# Patient Record
Sex: Male | Born: 1980
Health system: Southern US, Community
[De-identification: ages and names within clinical notes are randomized; demographics above are authoritative.]

## PROBLEM LIST (undated history)

## (undated) DIAGNOSIS — R079 Chest pain, unspecified: Secondary | ICD-10-CM

## (undated) DIAGNOSIS — K5190 Ulcerative colitis, unspecified, without complications: Secondary | ICD-10-CM

## (undated) HISTORY — PX: ARTHROSCOPIC REPAIR ACL: SUR80

## (undated) HISTORY — DX: Chest pain, unspecified: R07.9

## (undated) HISTORY — DX: Ulcerative colitis, unspecified, without complications: K51.90

---

## 2016-03-07 ENCOUNTER — Ambulatory Visit (INDEPENDENT_AMBULATORY_CARE_PROVIDER_SITE_OTHER): Payer: Managed Care, Other (non HMO) | Admitting: Physician Assistant

## 2016-03-07 VITALS — BP 128/84 | HR 65 | Temp 98.2°F | Resp 17 | Ht 67.5 in | Wt 164.0 lb

## 2016-03-07 DIAGNOSIS — R1032 Left lower quadrant pain: Secondary | ICD-10-CM | POA: Diagnosis not present

## 2016-03-07 DIAGNOSIS — R197 Diarrhea, unspecified: Secondary | ICD-10-CM

## 2016-03-07 LAB — POC MICROSCOPIC URINALYSIS (UMFC): MUCUS RE: ABSENT

## 2016-03-07 LAB — POCT CBC
Granulocyte percent: 51.7 %G (ref 37–80)
HCT, POC: 46.8 % (ref 43.5–53.7)
Hemoglobin: 16.7 g/dL (ref 14.1–18.1)
LYMPH, POC: 1.9 (ref 0.6–3.4)
MCH, POC: 30.8 pg (ref 27–31.2)
MCHC: 35.7 g/dL — AB (ref 31.8–35.4)
MCV: 86.3 fL (ref 80–97)
MID (CBC): 1.1 — AB (ref 0–0.9)
MPV: 7.5 fL (ref 0–99.8)
PLATELET COUNT, POC: 210 10*3/uL (ref 142–424)
POC Granulocyte: 3.3 (ref 2–6.9)
POC LYMPH %: 30.9 % (ref 10–50)
POC MID %: 17.4 % — AB (ref 0–12)
RBC: 5.42 M/uL (ref 4.69–6.13)
RDW, POC: 14.8 %
WBC: 6.3 10*3/uL (ref 4.6–10.2)

## 2016-03-07 LAB — POCT URINALYSIS DIP (MANUAL ENTRY)
BILIRUBIN UA: NEGATIVE
Bilirubin, UA: NEGATIVE
Glucose, UA: NEGATIVE
Leukocytes, UA: NEGATIVE
Nitrite, UA: NEGATIVE
PROTEIN UA: NEGATIVE
RBC UA: NEGATIVE
UROBILINOGEN UA: 0.2
pH, UA: 5.5

## 2016-03-07 MED ORDER — MESALAMINE 1.2 G PO TBEC: 4.8000 g | DELAYED_RELEASE_TABLET | Freq: Every day | ORAL | 2 refills | Status: DC

## 2016-03-07 NOTE — Patient Instructions (Addendum)
IF you received an x-ray today, you will receive an invoice from [facility]. Please contact [facility] at [phone] with questions or concerns regarding your invoice.   IF you received labwork today, you will receive an invoice from [facility]. Please contact [facility] at [phone] with questions or concerns regarding your invoice.   Our billing staff will not be able to assist you with questions regarding bills from these companies.  You will be contacted with the lab results as soon as they are available. The fastest way to get your results is to activate your My Chart account. Instructions are located on the last page of this paperwork. If you have not heard from us regarding the results in 2 weeks, please contact this office.    --I am refilling your medication. --please avoid anti-mobility medicines at this time.  We will let this past.  If you develop blood in stool, contact me. --I will contact you with the result of your sample  Food Choices to Help Relieve Diarrhea, Adult When you have diarrhea, the foods you eat and your eating habits are very important. Choosing the right foods and drinks can help relieve diarrhea. Also, because diarrhea can last up to 7 days, you need to replace lost fluids and electrolytes (such as sodium, potassium, and chloride) in order to help prevent dehydration. What general guidelines do I need to follow?  Slowly drink 1 cup (8 oz) of fluid for each episode of diarrhea. If you are getting enough fluid, your urine will be clear or pale yellow.  Eat starchy foods. Some good choices include white rice, white toast, pasta, low-fiber cereal, baked potatoes (without the skin), saltine crackers, and bagels.  Avoid large servings of any cooked vegetables.  Limit fruit to two servings per day. A serving is  cup or 1 small piece.  Choose foods with less than 2 g of fiber per serving.  Limit fats to less than 8 tsp (38 g) per  day.  Avoid fried foods.  Eat foods that have probiotics in them. Probiotics can be found in certain dairy products.  Avoid foods and beverages that may increase the speed at which food moves through the stomach and intestines (gastrointestinal tract). Things to avoid include:  High-fiber foods, such as dried fruit, raw fruits and vegetables, nuts, seeds, and whole grain foods.  Spicy foods and high-fat foods.  Foods and beverages sweetened with high-fructose corn syrup, honey, or sugar alcohols such as xylitol, sorbitol, and mannitol. What foods are recommended? Grains  White rice. White, [place]French, or pita breads (fresh or toasted), including plain rolls, buns, or bagels. White pasta. Saltine, soda, or graham crackers. Pretzels. Low-fiber cereal. Cooked cereals made with water (such as cornmeal, farina, or cream cereals). Plain muffins. Matzo. Melba toast. Zwieback. Vegetables  Potatoes (without the skin). Strained tomato and vegetable juices. Most well-cooked and canned vegetables without seeds. Tender lettuce. Fruits  Cooked or canned applesauce, apricots, cherries, fruit cocktail, grapefruit, peaches, pears, or plums. Fresh bananas, apples without skin, cherries, grapes, cantaloupe, grapefruit, peaches, oranges, or plums. Meat and Other Protein Products  Baked or boiled chicken. Eggs. Tofu. Fish. Seafood. Smooth peanut butter. Ground or well-cooked tender beef, ham, veal, lamb, pork, or poultry. Dairy  Plain yogurt, kefir, and unsweetened liquid yogurt. Lactose-free milk, buttermilk, or soy milk. Plain hard cheese. Beverages  Sport drinks. Clear broths. Diluted fruit juices (except prune). Regular, caffeine-free sodas such as ginger ale. Water. Decaffeinated teas. Oral rehydration solutions. Sugar-free beverages  not sweetened with sugar alcohols. Other  Bouillon, broth, or soups made from recommended foods. The items listed above may not be a complete list of recommended foods or  beverages. Contact your dietitian for more options.  What foods are not recommended? Grains  Whole grain, whole wheat, bran, or rye breads, rolls, pastas, crackers, and cereals. Wild or brown rice. Cereals that contain more than 2 g of fiber per serving. Corn tortillas or taco shells. Cooked or dry oatmeal. Granola. Popcorn. Vegetables  Raw vegetables. Cabbage, broccoli, Brussels sprouts, artichokes, baked beans, beet greens, corn, kale, legumes, peas, sweet potatoes, and yams. Potato skins. Cooked spinach and cabbage. Fruits  Dried fruit, including raisins and dates. Raw fruits. Stewed or dried prunes. Fresh apples with skin, apricots, mangoes, pears, raspberries, and strawberries. Meat and Other Protein Products  Chunky peanut butter. Nuts and seeds. Beans and lentils. Tomasa Blase. Dairy  High-fat cheeses. Milk, chocolate milk, and beverages made with milk, such as milk shakes. Cream. Ice cream. Sweets and Desserts  Sweet rolls, doughnuts, and sweet breads. Pancakes and waffles. Fats and Oils  Butter. Cream sauces. Margarine. Salad oils. Plain salad dressings. Olives. Avocados. Beverages  Caffeinated beverages (such as coffee, tea, soda, or energy drinks). Alcoholic beverages. Fruit juices with pulp. Prune juice. Soft drinks sweetened with high-fructose corn syrup or sugar alcohols. Other  Coconut. Hot sauce. Chili powder. Mayonnaise. Gravy. Cream-based or milk-based soups. The items listed above may not be a complete list of foods and beverages to avoid. Contact your dietitian for more information.  What should I do if I become dehydrated? Diarrhea can sometimes lead to dehydration. Signs of dehydration include dark urine and dry mouth and skin. If you think you are dehydrated, you should rehydrate with an oral rehydration solution. These solutions can be purchased at pharmacies, retail stores, or online. Drink -1 cup (120-240 mL) of oral rehydration solution each time you have an episode of  diarrhea. If drinking this amount makes your diarrhea worse, try drinking smaller amounts more often. For example, drink 1-3 tsp (5-15 mL) every 5-10 minutes. A general rule for staying hydrated is to drink 1-2 L of fluid per day. Talk to your health care provider about the specific amount you should be drinking each day. Drink enough fluids to keep your urine clear or pale yellow. This information is not intended to replace advice given to you by your health care provider. Make sure you discuss any questions you have with your health care provider. Document Released: 04/15/2003 Document Revised: 07/01/2015 Document Reviewed: 12/16/2012 Elsevier Interactive Patient Education  2017 [employer].

## 2016-03-07 NOTE — Progress Notes (Signed)
[facility] [address], [place] [address] [phone]  Date:  03/07/2016   Name:  Jordan Greene   DOB:  07/15/1980   MRN:  [id number]  PCP:  No primary care provider on file.    History of Present Illness:  Jordan Greene is a 36 y.o. male patient who [facility] of abdominal pain and diarrhea.    Chief Complaint  Patient presents with  . Diarrhea  . Abdominal Pain   15 episodes of green diarrhea 6 days ago.  This began to improve, 3 days later, appears to resolve.  5 days later, the diarrhea resurfaced to 8 episodes of diarrhea, which was brown without blood or black stool.  He has periumbilical pain after bowel movement.  No fever.  Yesterday, he had some dysuria only once but this resolved.  No hematuria, or frequency.   Hx of ulcerative colitis.  He has been about 2-3 weeks without his medication of mesalamine.  Changing providers, and does not have a pcp.  Plans to visit gastroenterologist in 7 days. No nausea or vomiting.  Does not recall eating anything that has had any sxs in others.    There are no active problems to display for this patient.   No past medical history on file.  No past surgical history on file.  Social History  Substance Use Topics  . Smoking status: Never Smoker  . Smokeless tobacco: Never Used  . Alcohol use No    No family history on file.  Not on File  Medication list has been reviewed and updated.  No current outpatient prescriptions on file prior to visit.   No current facility-administered medications on file prior to visit.     ROS ROS otherwise unremarkable unless listed above.   Physical Examination: BP 128/84 (BP Location: Right Arm, Patient Position: Sitting, Cuff Size: Normal)   Pulse 65   Temp 98.2 F (36.8 C) (Oral)   Resp 17   Ht 5' 7.5" (1.715 m)   Wt 164 lb (74.4 kg)   SpO2 99%   BMI 25.31 kg/m  Ideal Body Weight: Weight in (lb) to have BMI = 25: 161.7  Physical Exam   Constitutional: He is oriented to person, place, and time. He appears well-developed and well-nourished. No distress.  HENT:  Head: Normocephalic and atraumatic.  Eyes: Conjunctivae and EOM are normal. Pupils are equal, round, and reactive to light.  Cardiovascular: Normal rate.   Pulmonary/Chest: Effort normal. No respiratory distress.  Abdominal: Soft. Normal appearance and bowel sounds are normal. There is no hepatosplenomegaly. There is tenderness in the suprapubic area.  Neurological: He is alert and oriented to person, place, and time.  Skin: Skin is warm and dry. He is not diaphoretic.  Psychiatric: He has a normal mood and affect. His behavior is normal.    Results for orders placed or performed in visit on 03/07/16  POCT CBC  Result Value Ref Range   WBC 6.3 4.6 - 10.2 K/uL   Lymph, poc 1.9 0.6 - 3.4   POC LYMPH PERCENT 30.9 10 - 50 %L   MID (cbc) 1.1 (A) 0 - 0.9   POC MID % 17.4 (A) 0 - 12 %M   POC Granulocyte 3.3 2 - 6.9   Granulocyte percent 51.7 37 - 80 %G   RBC 5.42 4.69 - 6.13 M/uL   Hemoglobin 16.7 14.1 - 18.1 g/dL   HCT, POC 29.546.8 62.143.5 - 53.7 %  MCV 86.3 80 - 97 fL   MCH, POC 30.8 27 - 31.2 pg   MCHC 35.7 (A) 31.8 - 35.4 g/dL   RDW, POC 16.1 %   Platelet Count, POC 210 142 - 424 K/uL   MPV 7.5 0 - 99.8 fL  POCT Microscopic Urinalysis ([facility])  Result Value Ref Range   WBC,UR,HPF,POC None None WBC/hpf   RBC,UR,HPF,POC None None RBC/hpf   Bacteria None None, Too numerous to count   Mucus Absent Absent   Epithelial Cells, UR Per Microscopy None None, Too numerous to count cells/hpf  POCT urinalysis dipstick  Result Value Ref Range   Color, UA yellow yellow   Clarity, UA clear clear   Glucose, UA negative negative   Bilirubin, UA negative negative   Ketones, POC UA negative negative   Spec Grav, UA <=1.005    Blood, UA negative negative   pH, UA 5.5    Protein Ur, POC negative negative   Urobilinogen, UA 0.2    Nitrite, UA Negative Negative    Leukocytes, UA Negative Negative     Assessment and Plan: Jordan Greene is a 36 y.o. male who is here today for abdominal pain or diarrhea. --will restart her medication.  Possible colitis, and with is restarting, will obtain stool culture today.   --drug interaction with Levsin and medications for abdominal cramping.  Advised diarrhea friendly diet.  It appears to be improving again.  possibility lack of UC treatment could be culprit.  Will follow up if sxs do not improve or blood stools occur.  Left lower quadrant pain - Plan: POCT CBC, POCT Microscopic Urinalysis ([facility]), POCT urinalysis dipstick, GI Profile, Stool, PCR  Diarrhea, unspecified type - Plan: GI Profile, Stool, PCR  Trena Platt, PA-C [facility] 2/2/20188:58 AM

## 2016-03-08 LAB — GI PROFILE, STOOL, PCR
ASTROVIRUS: NOT DETECTED
Adenovirus F 40/41: NOT DETECTED
C difficile toxin A/B: NOT DETECTED
CYCLOSPORA CAYETANENSIS: NOT DETECTED
Campylobacter: NOT DETECTED
Cryptosporidium: NOT DETECTED
ENTAMOEBA HISTOLYTICA: NOT DETECTED
ENTEROPATHOGENIC E COLI: NOT DETECTED
Enteroaggregative E coli: NOT DETECTED
Enterotoxigenic E coli: NOT DETECTED
Giardia lamblia: NOT DETECTED
Norovirus GI/GII: NOT DETECTED
PLESIOMONAS SHIGELLOIDES: NOT DETECTED
ROTAVIRUS A: NOT DETECTED
SAPOVIRUS: NOT DETECTED
SHIGA-TOXIN-PRODUCING E COLI: NOT DETECTED
SHIGELLA/ENTEROINVASIVE E COLI: NOT DETECTED
Salmonella: NOT DETECTED
VIBRIO CHOLERAE: NOT DETECTED
VIBRIO: NOT DETECTED
Yersinia enterocolitica: NOT DETECTED

## 2016-06-20 ENCOUNTER — Other Ambulatory Visit: Payer: Self-pay | Admitting: Emergency Medicine

## 2016-06-20 MED ORDER — MESALAMINE 1.2 G PO TBEC: 4.8000 g | DELAYED_RELEASE_TABLET | Freq: Every day | ORAL | 2 refills | Status: DC

## 2017-01-24 ENCOUNTER — Encounter: Payer: Managed Care, Other (non HMO) | Admitting: Physician Assistant

## 2017-01-25 ENCOUNTER — Encounter: Payer: Managed Care, Other (non HMO) | Admitting: Physician Assistant

## 2017-05-16 ENCOUNTER — Encounter: Payer: Self-pay | Admitting: Physician Assistant

## 2017-09-14 ENCOUNTER — Telehealth: Payer: Self-pay | Admitting: Gastroenterology

## 2017-09-14 NOTE — Telephone Encounter (Signed)
I called Dr Chales AbrahamsGupta and gave him this message.

## 2017-09-21 ENCOUNTER — Encounter: Payer: Self-pay | Admitting: Gastroenterology

## 2017-09-25 ENCOUNTER — Encounter

## 2017-09-25 ENCOUNTER — Ambulatory Visit (INDEPENDENT_AMBULATORY_CARE_PROVIDER_SITE_OTHER): Payer: Managed Care, Other (non HMO) | Admitting: Gastroenterology

## 2017-09-25 ENCOUNTER — Encounter: Payer: Self-pay | Admitting: Gastroenterology

## 2017-09-25 ENCOUNTER — Other Ambulatory Visit (INDEPENDENT_AMBULATORY_CARE_PROVIDER_SITE_OTHER): Payer: Managed Care, Other (non HMO)

## 2017-09-25 VITALS — BP 108/72 | HR 81 | Ht 68.0 in | Wt 152.5 lb

## 2017-09-25 DIAGNOSIS — K51919 Ulcerative colitis, unspecified with unspecified complications: Secondary | ICD-10-CM | POA: Diagnosis not present

## 2017-09-25 LAB — CBC WITH DIFFERENTIAL/PLATELET
Basophils Absolute: 0 10*3/uL (ref 0.0–0.1)
Basophils Relative: 0.2 % (ref 0.0–3.0)
EOS ABS: 0 10*3/uL (ref 0.0–0.7)
Eosinophils Relative: 0.1 % (ref 0.0–5.0)
HEMATOCRIT: 47.3 % (ref 39.0–52.0)
HEMOGLOBIN: 15.8 g/dL (ref 13.0–17.0)
LYMPHS PCT: 12.3 % (ref 12.0–46.0)
Lymphs Abs: 1 10*3/uL (ref 0.7–4.0)
MCHC: 33.4 g/dL (ref 30.0–36.0)
MCV: 89.4 fl (ref 78.0–100.0)
MONO ABS: 0.5 10*3/uL (ref 0.1–1.0)
Monocytes Relative: 5.7 % (ref 3.0–12.0)
Neutro Abs: 6.9 10*3/uL (ref 1.4–7.7)
Neutrophils Relative %: 81.7 % — ABNORMAL HIGH (ref 43.0–77.0)
Platelets: 247 10*3/uL (ref 150.0–400.0)
RBC: 5.29 Mil/uL (ref 4.22–5.81)
RDW: 14.6 % (ref 11.5–15.5)
WBC: 8.4 10*3/uL (ref 4.0–10.5)

## 2017-09-25 LAB — COMPREHENSIVE METABOLIC PANEL
ALBUMIN: 4.3 g/dL (ref 3.5–5.2)
ALK PHOS: 52 U/L (ref 39–117)
ALT: 19 U/L (ref 0–53)
AST: 14 U/L (ref 0–37)
BUN: 10 mg/dL (ref 6–23)
CALCIUM: 9.8 mg/dL (ref 8.4–10.5)
CHLORIDE: 102 meq/L (ref 96–112)
CO2: 32 mEq/L (ref 19–32)
CREATININE: 1 mg/dL (ref 0.40–1.50)
GFR: 88.98 mL/min (ref >60.00)
Glucose, Bld: 85 mg/dL (ref 70–99)
POTASSIUM: 5 meq/L (ref 3.5–5.1)
SODIUM: 139 meq/L (ref 135–145)
TOTAL PROTEIN: 7.2 g/dL (ref 6.0–8.3)
Total Bilirubin: 0.4 mg/dL (ref 0.2–1.2)

## 2017-09-25 LAB — C-REACTIVE PROTEIN: CRP: 0.1 mg/dL — AB (ref 0.5–20.0)

## 2017-09-25 MED ORDER — PREDNISONE 10 MG PO TABS: 10.0000 mg | ORAL_TABLET | ORAL | 0 refills | Status: DC

## 2017-09-25 MED ORDER — MESALAMINE 1.2 G PO TBEC: 1.2000 g | DELAYED_RELEASE_TABLET | Freq: Four times a day (QID) | ORAL | 11 refills | Status: DC

## 2017-09-25 MED ORDER — PANTOPRAZOLE SODIUM 40 MG PO TBEC: 40.0000 mg | DELAYED_RELEASE_TABLET | Freq: Every day | ORAL | 4 refills | Status: DC

## 2017-09-25 NOTE — Patient Instructions (Addendum)
If you are age 38 or older, your body mass index should be between 23-30. Your Body mass index is 23.19 kg/m. If this is out of the aforementioned range listed, please consider follow up with your Primary Care Provider.  If you are age 38 or younger, your body mass index should be between 19-25. Your Body mass index is 23.19 kg/m. If this is out of the aformentioned range listed, please consider follow up with your Primary Care Provider.   You have been scheduled for a colonoscopy. Please follow written instructions given to you at your visit today.  Please pick up your prep supplies at the pharmacy within the next 1-3 days. If you use inhalers (even only as needed), please bring them with you on the day of your procedure. Your physician has requested that you go to www.startemmi.com and enter the access code given to you at your visit today. This web site gives a general overview about your procedure. However, you should still follow specific instructions given to you by our office regarding your preparation for the procedure.  We have sent the following medications to your pharmacy for you to pick up at your convenience: Prednisone 10mg  Take 2 tablets by mouth once daily for two weeks then decrease to 1 tablet by mouth once daily for two weeks, then stop. Protonix 40mg  once daily for 30 days. Lialda 1.2g four times daily.  Please purchase the following medications over the counter and take as directed: Miralax  Please go to the lab on the 2nd floor [address] before you leave the office today.   Thank you,  Dr. Lynann Bolognaajesh Gupta

## 2017-09-25 NOTE — Progress Notes (Signed)
IMPRESSION and PLAN:    #1.  Pan ulcerative colitis-diagnosed in early 30s, colonoscopy in 2015 in [place] showed pancolitis.  Patient had been on azathioprine 75 mg p.o. once a day but stopped and mesalamine 1.2 g/tab. 4/day. With recent exacerbation treated with antibiotics (oflox/ornidazole x 8 days -meds from [place]) and prednisone.  Currently on tapering dose. Plan: -Taper down prednisone to 20mg  po qd x 2 weeks, 10mg  po qd x 2 weeks, then stop. -Stool tests for GI pathogen including C. difficile and WBCs. -Check CBC, CMP and CRP, TB gold and HBsAg today. -Proceed with colonoscopy.  I discussed risks and benefits.  He will be given MiraLAX preparation. -He will continue taking mesalamine 1.2 g/tab. 4/day. -I have discussed regarding biologic's and immunomodulator therapy.  Depending upon the above blood tests and colonoscopy, would like to reinitiate azathioprine with or without Xeljanz or Humira.  Discussed risks and benefits. -Consider bone density scan when he follows up with Dr. Mathis BudUppin. -Discussed above with the patient and patient's family.     #2. Heartburn -likely related to prednisone. Plan: Start  protonix 30mg  po qd, 4 refills.       HPI:    Chief Complaint:   Jordan Greene is a 37 y.o. male  With ulcerative colitis diagnosed in early 6330s. Had colonoscopy and then subsequently sigmoidoscopy in 2015 in [place] showing pancolitis. Had CT scan 01/03/2017 which showed fairly long segment of rectosigmoid colon-mild colitis, stricture could not be ruled out. Started having increasing diarrhea with bloody bowel movements after eating out more than a month ago. Seen by Dr.Uppin.  Has been started on steroids, antibiotics from [place] He has been doing much better Baseline bowel movements of 5-6 looser bowel movements per day without any blood. Denies having any joint pains or skin rash Denies having any significant back pain. Comes to the GI clinic for follow-up visit. Has  been having heartburn ever since he has been on prednisone.   Past Medical History:  Diagnosis Date  . Ulcerative colitis (HCC)     Current Outpatient Medications  Medication Sig Dispense Refill  . AMBULATORY NON FORMULARY MEDICATION Ofloxacin/ornidozole 200mg  2 PO QD    . mesalamine (LIALDA) 1.2 g EC tablet Take 4 tablets (4.8 g total) by mouth daily with breakfast. 120 tablet 2  . predniSONE (DELTASONE) 10 MG tablet Take 20 mg by mouth daily.     No current facility-administered medications for this visit.     Past Surgical History:  Procedure Laterality Date  . ARTHROSCOPIC REPAIR ACL      Family History  Problem Relation Age of Onset  . Colon cancer Neg Hx   . Esophageal cancer Neg Hx   . Heart disease Neg Hx     Social History   Tobacco Use  . Smoking status: Never Smoker  . Smokeless tobacco: Never Used  Substance Use Topics  . Alcohol use: No  . Drug use: No    Allergies  Allergen Reactions  . Other     Dairy-Milk Red Meat     Review of Systems: All systems reviewed and negative except where noted in HPI.    Physical Exam:     BP 108/72   Pulse 81   Ht 5\' 8"  (1.727 m)   Wt 152 lb 8 oz (69.2 kg)   BMI 23.19 kg/m  @WEIGHTLAST3 @ GENERAL:  Alert, oriented, cooperative, not in acute distress. PSYCH: :Pleasant, normal mood and affect. HEENT:  conjunctiva pink, mucous  membranes moist, neck supple without masses. No jaundice. CARDIAC:  S1 S2 normal. No murmers. PULM: Normal respiratory effort, lungs CTA bilaterally, no wheezing. ABDOMEN: Inspection: No visible peristalsis, no abnormal pulsations, skin normal.  Palpation/percussion: Soft, nontender, nondistended, no rigidity, no abnormal dullness to percussion, no hepatosplenomegaly and no palpable abdominal masses.  Auscultation: Normal bowel sounds, no abdominal bruits. Rectal exam: Deferred SKIN:  turgor, no lesions seen. Musculoskeletal:  Normal muscle tone, normal strength. NEURO: Alert and  oriented x 3, no focal neurologic deficits.   Veleta Yamamoto,MD 09/25/2017, 9:27 AM   CC Dr Mathis BudUppin

## 2017-09-27 ENCOUNTER — Other Ambulatory Visit (INDEPENDENT_AMBULATORY_CARE_PROVIDER_SITE_OTHER): Payer: Managed Care, Other (non HMO)

## 2017-09-27 ENCOUNTER — Other Ambulatory Visit: Payer: Self-pay

## 2017-09-27 ENCOUNTER — Other Ambulatory Visit: Payer: Managed Care, Other (non HMO)

## 2017-09-27 DIAGNOSIS — K51919 Ulcerative colitis, unspecified with unspecified complications: Secondary | ICD-10-CM

## 2017-09-27 LAB — QUANTIFERON-TB GOLD PLUS
NIL: 0.02 [IU]/mL
QUANTIFERON-TB GOLD PLUS: NEGATIVE
TB1-NIL: 0.01 IU/mL
TB2-NIL: 0.02 [IU]/mL

## 2017-09-27 NOTE — Addendum Note (Signed)
Addended by: Harley AltoPRICE, KRISTY M on: 09/27/2017 09:44 AM   Modules accepted: Orders

## 2017-09-28 LAB — HEPATITIS B SURFACE ANTIGEN: Hepatitis B Surface Ag: NONREACTIVE

## 2017-10-01 ENCOUNTER — Other Ambulatory Visit: Payer: Self-pay

## 2017-10-01 DIAGNOSIS — K51919 Ulcerative colitis, unspecified with unspecified complications: Secondary | ICD-10-CM

## 2017-10-01 MED ORDER — AZATHIOPRINE 75 MG PO TABS: 75.0000 mg | ORAL_TABLET | Freq: Every day | ORAL | 1 refills | Status: DC

## 2017-10-04 LAB — GASTROINTESTINAL PATHOGEN PANEL PCR
C. difficile Tox A/B, PCR: NOT DETECTED
CAMPYLOBACTER, PCR: NOT DETECTED
Cryptosporidium, PCR: NOT DETECTED
E COLI 0157, PCR: NOT DETECTED
E coli (ETEC) LT/ST PCR: NOT DETECTED
E coli (STEC) stx1/stx2, PCR: NOT DETECTED
Giardia lamblia, PCR: NOT DETECTED
Norovirus, PCR: NOT DETECTED
Rotavirus A, PCR: NOT DETECTED
SALMONELLA, PCR: NOT DETECTED
SHIGELLA, PCR: NOT DETECTED

## 2017-10-04 LAB — FECAL LACTOFERRIN, QUANT
FECAL LACTOFERRIN: NEGATIVE
MICRO NUMBER:: 91003462
SPECIMEN QUALITY:: ADEQUATE

## 2017-10-17 ENCOUNTER — Ambulatory Visit: Payer: Self-pay | Admitting: Gastroenterology

## 2017-11-02 ENCOUNTER — Encounter: Payer: Self-pay | Admitting: Gastroenterology

## 2017-11-16 ENCOUNTER — Encounter: Payer: Self-pay | Admitting: Gastroenterology

## 2017-11-16 ENCOUNTER — Ambulatory Visit (AMBULATORY_SURGERY_CENTER): Payer: Managed Care, Other (non HMO) | Admitting: Gastroenterology

## 2017-11-16 VITALS — BP 101/62 | HR 73 | Temp 98.9°F | Resp 17 | Ht 68.0 in | Wt 152.5 lb

## 2017-11-16 DIAGNOSIS — K51919 Ulcerative colitis, unspecified with unspecified complications: Secondary | ICD-10-CM | POA: Diagnosis present

## 2017-11-16 HISTORY — PX: COLONOSCOPY: SHX174

## 2017-11-16 MED ORDER — SODIUM CHLORIDE 0.9 % IV SOLN: 500.0000 mL | Freq: Once | INTRAVENOUS | Status: DC

## 2017-11-16 MED ORDER — AZATHIOPRINE 75 MG PO TABS: 75.0000 mg | ORAL_TABLET | Freq: Every day | ORAL | 1 refills | Status: DC

## 2017-11-16 NOTE — Patient Instructions (Signed)
Impression/Recommendations:  Colitis handout given to patient.  Resume previous diet. Continue present medications.  Restart azthioprine 75 mg. By mouth once daily.  Await pathology results.  Return to GI office in 4 weeks.  YOU HAD AN ENDOSCOPIC PROCEDURE TODAY AT THE [facility]:   Refer to the procedure report that was given to you for any specific questions about what was found during the examination.  If the procedure report does not answer your questions, please call your gastroenterologist to clarify.  If you requested that your care partner not be given the details of your procedure findings, then the procedure report has been included in a sealed envelope for you to review at your convenience later.  YOU SHOULD EXPECT: Some feelings of bloating in the abdomen. Passage of more gas than usual.  Walking can help get rid of the air that was put into your GI tract during the procedure and reduce the bloating. If you had a lower endoscopy (such as a colonoscopy or flexible sigmoidoscopy) you may notice spotting of blood in your stool or on the toilet paper. If you underwent a bowel prep for your procedure, you may not have a normal bowel movement for a few days.  Please Note:  You might notice some irritation and congestion in your nose or some drainage.  This is from the oxygen used during your procedure.  There is no need for concern and it should clear up in a day or so.  SYMPTOMS TO REPORT IMMEDIATELY:   Following lower endoscopy (colonoscopy or flexible sigmoidoscopy):  Excessive amounts of blood in the stool  Significant tenderness or worsening of abdominal pains  Swelling of the abdomen that is new, acute  Fever of 100F or higher   Following upper endoscopy (EGD)  Vomiting of blood or coffee ground material  New chest pain or pain under the shoulder blades  Painful or persistently difficult swallowing  New shortness of breath  Fever of 100F or  higher  Black, tarry-looking stools  For urgent or emergent issues, a gastroenterologist can be reached at any hour by calling [phone].   DIET:  We do recommend a small meal at first, but then you may proceed to your regular diet.  Drink plenty of fluids but you should avoid alcoholic beverages for 24 hours.  ACTIVITY:  You should plan to take it easy for the rest of today and you should NOT DRIVE or use heavy machinery until tomorrow (because of the sedation medicines used during the test).    FOLLOW UP: Our staff will call the number listed on your records the next business day following your procedure to check on you and address any questions or concerns that you may have regarding the information given to you following your procedure. If we do not reach you, we will leave a message.  However, if you are feeling well and you are not experiencing any problems, there is no need to return our call.  We will assume that you have returned to your regular daily activities without incident.  If any biopsies were taken you will be contacted by phone or by letter within the next 1-3 weeks.  Please call us at [phone] if you have not heard about the biopsies in 3 weeks.    SIGNATURES/CONFIDENTIALITY: You and/or your care partner have signed paperwork which will be entered into your electronic medical record.  These signatures attest to the fact that that the information above on your After  Visit Summary has been reviewed and is understood.  Full responsibility of the confidentiality of this discharge information lies with you and/or your care-partner. 

## 2017-11-16 NOTE — Op Note (Signed)
[facility] Patient Name: Jordan Greene Procedure Date: 11/16/2017 3:23 PM MRN: [id number] Endoscopist: Lynann Bologna , MD Age: 37 Referring MD:  Date of Birth: Dec 04, 1980 Gender: Male Account #: [id number] Procedure:                Colonoscopy Indications:              Rectal bleeding - H/O ulcerative colitis-Dx in                            early 4s, colonoscopy in 2015 in [place] showed ?                            pancolitis. Patient had been on azathioprine 75 mg                            p.o. once a day but stopped and mesalamine 1.2                            g/tab. 4/day. With recent exacerbation treated with                            antibiotics (oflox/ornidazole x 8 days -meds from                            [place]) and prednisone. Currently on tapering dose. Medicines:                Monitored Anesthesia Care Procedure:                Pre-Anesthesia Assessment:                           - Prior to the procedure, a History and Physical                            was performed, and patient medications and                            allergies were reviewed. The patient's tolerance of                            previous anesthesia was also reviewed. The risks                            and benefits of the procedure and the sedation                            options and risks were discussed with the patient.                            All questions were answered, and informed consent                            was obtained. Prior Anticoagulants: The patient has  taken no previous anticoagulant or antiplatelet                            agents. ASA Grade Assessment: I - A normal, healthy                            patient. After reviewing the risks and benefits,                            the patient was deemed in satisfactory condition to                            undergo the procedure.                           After obtaining informed  consent, the colonoscope                            was passed under direct vision. Throughout the                            procedure, the patient's blood pressure, pulse, and                            oxygen saturations were monitored continuously. The                            Colonoscope was introduced through the anus and                            advanced to the 4 cm into the ileum. The                            colonoscopy was performed without difficulty. The                            patient tolerated the procedure well. The quality                            of the bowel preparation was excellent. Scope In: 3:43:09 PM Scope Out: 4:06:00 PM Scope Withdrawal Time: 0 hours 15 minutes 13 seconds  Total Procedure Duration: 0 hours 22 minutes 51 seconds  Findings:                 Inflammation characterized by erosions, erythema                            and friability was found in a continuous and                            circumferential pattern from the rectum to the                            descending colon upto 45 cm. This was mild to  moderate in severity. Biopsies were taken with a                            cold forceps for histology. Estimated blood loss                            was minimal. The remaining mucosa -including                            transverse colon, ascending colon was normal with                            well preserved vascular pattern. Biopsies were                            taken with a cold forceps for histology from the                            right colon as well. This was graded as Mayo Score                            2 (moderate, with marked erythema, absent vascular                            pattern, friability, erosions).                           The TI had two 2 mm small non-bleeding erosions. No                            stigmata of recent bleeding were seen. Biopsies                            were  taken with a cold forceps for histology. Complications:            No immediate complications. Estimated Blood Loss:     Estimated blood loss was minimal. Impression:               - Left-sided ulcerative colitis (mild-moderate                            activity) (biopsied)                           - Two very small erosions terminal ileum. Biopsied. Recommendation:           - Patient has a contact number available for                            emergencies. The signs and symptoms of potential                            delayed complications were discussed with the  patient. Return to normal activities tomorrow.                            Written discharge instructions were provided to the                            patient.                           - Resume previous diet.                           - Continue present medications. Restart                            azathioprine 75 mg p.o. once a day as before.                           - Await pathology results.                           - Return to my office in 4 weeks. Lynann Bologna, MD 11/16/2017 4:22:29 PM This report has been signed electronically.

## 2017-11-16 NOTE — Progress Notes (Signed)
Called to room to assist during endoscopic procedure.  Patient ID and intended procedure confirmed with present staff. Received instructions for my participation in the procedure from the performing physician.  

## 2017-11-16 NOTE — Progress Notes (Signed)
Dr. Chales Abrahams talking with pt. And his wife at length after completion of vital sign monitoring, thus prolonging time to discharge.

## 2017-11-16 NOTE — Progress Notes (Signed)
To PACU, vss patent aw report to rn 

## 2017-11-16 NOTE — Progress Notes (Signed)
History reviewed today 

## 2017-11-19 ENCOUNTER — Telehealth: Payer: Self-pay

## 2017-11-19 NOTE — Telephone Encounter (Signed)
  Follow up Call-  Call back number 11/16/2017  Post procedure Call Back phone  # [phone]  Permission to leave phone message Yes     Patient questions:  Do you have a fever, pain , or abdominal swelling? No. Pain Score  0 *  Have you tolerated food without any problems? Yes.    Have you been able to return to your normal activities? Yes.    Do you have any questions about your discharge instructions: Diet   No. Medications  No. Follow up visit  No.  Do you have questions or concerns about your Care? No.  Actions: * If pain score is 4 or above: No action needed, pain <4.

## 2017-11-27 ENCOUNTER — Encounter: Payer: Self-pay | Admitting: Gastroenterology

## 2017-12-16 ENCOUNTER — Other Ambulatory Visit: Payer: Self-pay | Admitting: Gastroenterology

## 2017-12-25 ENCOUNTER — Ambulatory Visit: Payer: Managed Care, Other (non HMO) | Admitting: Gastroenterology

## 2018-11-27 ENCOUNTER — Encounter: Payer: Self-pay | Admitting: Gastroenterology

## 2018-11-27 ENCOUNTER — Other Ambulatory Visit: Payer: Self-pay

## 2018-11-27 ENCOUNTER — Ambulatory Visit (INDEPENDENT_AMBULATORY_CARE_PROVIDER_SITE_OTHER): Payer: BC Managed Care – PPO | Admitting: Gastroenterology

## 2018-11-27 VITALS — BP 110/72 | HR 86 | Temp 97.9°F | Ht 68.0 in | Wt 159.1 lb

## 2018-11-27 DIAGNOSIS — K51919 Ulcerative colitis, unspecified with unspecified complications: Secondary | ICD-10-CM

## 2018-11-27 MED ORDER — AZATHIOPRINE 50 MG PO TABS: 50.0000 mg | ORAL_TABLET | Freq: Every day | ORAL | 6 refills | Status: DC

## 2018-11-27 MED ORDER — PREDNISONE 10 MG PO TABS: ORAL_TABLET | ORAL | 0 refills | Status: DC

## 2018-11-27 NOTE — Progress Notes (Signed)
IMPRESSION and PLAN:    #1. Left sided ulcerative colitis-Dx at age 38 on colon 75 in [place]. Was treated with mesalamine/azathioprine 75 mg PO QD (stopped AZA on his own as he got better). Last colon 11/2017- UC upto 45 cm (mid- desc colon) with mod activity on mesalamine and advised to resume AZA (didn't pick prescription). Moved back to [place]. With excerebration.  Plan: - Lialda 4.8g po qd to continue - Start prednisone 40 mg p.o. once a day for 1 week, 30 mg p.o. once a day for 1 week, followed by 20 mg p.o. once a day for 2 weeks, then 10 mg p.o. once a day for 2 weeks and then stop. - Restart imuran 50 mg po qd #30, 6 refiils - Blood tests - CBC, CMP, CRP, TPMT level, TB gold and HBsAg. He wants to get it done in next 2 weeks when he has appt with Dr Rica Records - RTC 12 weeks.  May need repeat colonoscopy in 6 months to a year to ensure mucosal healing. -Consider bone density scan when he follows up with Dr. Rica Records.   HPI:    Jordan Greene is a 38 y.o. male Was doing very well on mesalamine 4.8 g p.o. once a day Did not pick up Imuran after colonoscopy. Everybody in the family had mild gastroenteritis 2 months ago including his daughter and wife.  However, he continued to have problems and started having bloody diarrhea 4 to 6 weeks ago.  Apparently stool studies were negative.  He was given short course of prednisone for 4 to 5 days which did result in improved stool frequency from 7/day down to 3/day.  Denies having any urgency, or nocturnal symptoms.  Has lost 3 to 4 pounds over the last 1 month.  No fever or chills.  Associated mild lower abdominal discomfort.  Denies use of antibiotics or nonsteroidals.  Willing to go back on Imuran.  Moved back from [place].  Past Medical History:  Diagnosis Date  . Ulcerative colitis ([place])     Current Outpatient Medications  Medication Sig Dispense Refill  . mesalamine (LIALDA) 1.2 g EC tablet Take 4 tablets (4.8 g total) by mouth  daily with breakfast. 120 tablet 2  . Vitamin D, Ergocalciferol, (DRISDOL) 1.25 MG (50000 UT) CAPS capsule 1 capsule once a week.    . azaTHIOprine (IMURAN) 50 MG tablet Take 1 tablet (50 mg total) by mouth daily. 30 tablet 6  . predniSONE (DELTASONE) 10 MG tablet We have sent the following medications to your pharmacy for you to pick up at your convenience: Prednisone 10mg  tablet 40mg  by mouth x 1 week. 30mg  by mouth x 1 week. 20mg  by mouth x 2 weeks. 10mg  by mouth x 2 weeks. 100 tablet 0   No current facility-administered medications for this visit.     Past Surgical History:  Procedure Laterality Date  . ARTHROSCOPIC REPAIR ACL    . COLONOSCOPY  11/16/2017    Family History  Problem Relation Age of Onset  . Colon cancer Neg Hx   . Esophageal cancer Neg Hx   . Heart disease Neg Hx   . Colon polyps Neg Hx   . Rectal cancer Neg Hx   . Stomach cancer Neg Hx     Social History   Tobacco Use  . Smoking status: Never Smoker  . Smokeless tobacco: Never Used  Substance Use Topics  . Alcohol use: No  . Drug use: No  Allergies  Allergen Reactions  . Other     Dairy-Milk Red Meat     Review of Systems: All systems reviewed and negative except where noted in HPI.    Physical Exam:     BP 110/72   Pulse 86   Temp 97.9 F (36.6 C)   Ht 5\' 8"  (1.727 m)   Wt 159 lb 2 oz (72.2 kg)   BMI 24.19 kg/m  @WEIGHTLAST3 @ GENERAL:  Alert, oriented, cooperative, not in acute distress. PSYCH: :Pleasant, normal mood and affect. HEENT:  conjunctiva pink, mucous membranes moist, neck supple without masses. No jaundice. CARDIAC:  S1 S2 normal. No murmers. PULM: Normal respiratory effort, lungs CTA bilaterally, no wheezing. ABDOMEN: Inspection: No visible peristalsis, no abnormal pulsations, skin normal.  Palpation/percussion: Soft, mild tenderness on deep palpation, no rebound., nondistended, no rigidity, no abnormal dullness to percussion, no hepatosplenomegaly and no palpable  abdominal masses.  Auscultation: Normal bowel sounds, no abdominal bruits. Rectal exam: Deferred SKIN:  turgor, no lesions seen. Musculoskeletal:  Normal muscle tone, normal strength. NEURO: Alert and oriented x 3, no focal neurologic deficits.   Brownie Nehme,MD 11/27/2018, 6:45 PM   CC Dr 

## 2018-11-27 NOTE — Patient Instructions (Signed)
If you are age 39 or older, your body mass index should be between 23-30. Your Body mass index is 24.19 kg/m. If this is out of the aforementioned range listed, please consider follow up with your Primary Care Provider.  If you are age 68 or younger, your body mass index should be between 19-25. Your Body mass index is 24.19 kg/m. If this is out of the aformentioned range listed, please consider follow up with your Primary Care Provider.   We have sent the following medications to your pharmacy for you to pick up at your convenience: Prednisone Imuran  Please have the following labs drawn at Dr. Chipper Herb office:  CBC,CMP,CRP,RPMT,TB Gold, HBsAg  Follow up in 12 weeks.  Thank you,  Dr. Jackquline Denmark

## 2018-12-13 ENCOUNTER — Other Ambulatory Visit: Payer: Self-pay | Admitting: Gastroenterology

## 2018-12-21 ENCOUNTER — Other Ambulatory Visit: Payer: Self-pay | Admitting: Gastroenterology

## 2019-01-09 ENCOUNTER — Other Ambulatory Visit: Payer: Self-pay | Admitting: Gastroenterology

## 2019-02-01 ENCOUNTER — Other Ambulatory Visit: Payer: Self-pay | Admitting: Gastroenterology

## 2019-02-17 ENCOUNTER — Other Ambulatory Visit: Payer: Self-pay | Admitting: Gastroenterology

## 2019-04-19 ENCOUNTER — Ambulatory Visit: Payer: BC Managed Care – PPO | Attending: Internal Medicine

## 2019-04-19 DIAGNOSIS — Z23 Encounter for immunization: Secondary | ICD-10-CM

## 2019-04-19 NOTE — Progress Notes (Signed)
   Covid-19 Vaccination Clinic  Name:  Jordan Greene    MRN: [id number] DOB: [birth year]  04/19/2019  Mr. [name] was observed post Covid-19 immunization for 15 minutes without incident. He was provided with Vaccine Information Sheet and instruction to access the V-Safe system.   Mr. [name] was instructed to call 911 with any severe reactions post vaccine: Marland Kitchen Difficulty breathing  . Swelling of face and throat  . A fast heartbeat  . A bad rash all over body  . Dizziness and weakness   Immunizations Administered    Name Date Dose VIS Date Route   Moderna COVID-19 Vaccine 04/19/2019  1:07 PM 0.5 mL 01/07/2019 Intramuscular   Manufacturer: Moderna   Lot: [id number]   NDC: 35456-256-38

## 2019-05-21 ENCOUNTER — Ambulatory Visit: Payer: BC Managed Care – PPO | Attending: Internal Medicine

## 2019-05-21 DIAGNOSIS — Z23 Encounter for immunization: Secondary | ICD-10-CM

## 2019-05-21 NOTE — Progress Notes (Signed)
   Covid-19 Vaccination Clinic  Name:  Jordan Greene    MRN: [id number] DOB: 1980-07-02  05/21/2019  [name] was observed post Covid-19 immunization for 15 minutes without incident. He was provided with Vaccine Information Sheet and instruction to access the V-Safe system.   [name] was instructed to call 911 with any severe reactions post vaccine: Marland Kitchen Difficulty breathing  . Swelling of face and throat  . A fast heartbeat  . A bad rash all over body  . Dizziness and weakness   Immunizations Administered    Name Date Dose VIS Date Route   Moderna COVID-19 Vaccine 05/21/2019 12:28 PM 0.5 mL 01/07/2019 Intramuscular   Manufacturer: Moderna   Lot: [id number]   NDC: 82641-583-09

## 2019-08-22 ENCOUNTER — Other Ambulatory Visit: Payer: Self-pay | Admitting: Gastroenterology

## 2020-01-13 ENCOUNTER — Other Ambulatory Visit: Payer: Self-pay | Admitting: Gastroenterology

## 2020-01-20 DIAGNOSIS — Z79899 Other long term (current) drug therapy: Secondary | ICD-10-CM | POA: Diagnosis not present

## 2020-01-20 DIAGNOSIS — Z23 Encounter for immunization: Secondary | ICD-10-CM | POA: Diagnosis not present

## 2020-01-20 DIAGNOSIS — E559 Vitamin D deficiency, unspecified: Secondary | ICD-10-CM | POA: Diagnosis not present

## 2020-01-20 DIAGNOSIS — Z0000 Encounter for general adult medical examination without abnormal findings: Secondary | ICD-10-CM | POA: Diagnosis not present

## 2020-03-11 DIAGNOSIS — Z20822 Contact with and (suspected) exposure to covid-19: Secondary | ICD-10-CM | POA: Diagnosis not present

## 2020-03-18 ENCOUNTER — Other Ambulatory Visit: Payer: Self-pay | Admitting: Gastroenterology

## 2020-05-28 ENCOUNTER — Telehealth: Payer: Self-pay | Admitting: Gastroenterology

## 2020-05-28 NOTE — Telephone Encounter (Signed)
Pt is having a diverticulitis flare up. He is scheduled to see Dr. Chales Abrahams on 5/13 but would like some advise in the meantime.

## 2020-05-28 NOTE — Telephone Encounter (Signed)
Pt reports he sent a message to another doctor of his and he has been taken care of and thinks he is good now. He knows to call back with any other problems.

## 2020-06-18 ENCOUNTER — Encounter: Payer: Self-pay | Admitting: Gastroenterology

## 2020-06-18 ENCOUNTER — Ambulatory Visit (INDEPENDENT_AMBULATORY_CARE_PROVIDER_SITE_OTHER): Payer: BC Managed Care – PPO | Admitting: Gastroenterology

## 2020-06-18 ENCOUNTER — Other Ambulatory Visit: Payer: Self-pay

## 2020-06-18 VITALS — BP 112/78 | HR 68 | Ht 68.0 in | Wt 165.0 lb

## 2020-06-18 DIAGNOSIS — K51919 Ulcerative colitis, unspecified with unspecified complications: Secondary | ICD-10-CM

## 2020-06-18 MED ORDER — PREDNISONE 10 MG PO TABS: ORAL_TABLET | ORAL | 0 refills | Status: DC

## 2020-06-18 MED ORDER — AZATHIOPRINE 50 MG PO TABS: 50.0000 mg | ORAL_TABLET | Freq: Every day | ORAL | 6 refills | Status: DC

## 2020-06-18 NOTE — Patient Instructions (Signed)
If you are age 41 or older, your body mass index should be between 23-30. Your Body mass index is 25.09 kg/m. If this is out of the aforementioned range listed, please consider follow up with your Primary Care Provider.  If you are age 51 or younger, your body mass index should be between 19-25. Your Body mass index is 25.09 kg/m. If this is out of the aformentioned range listed, please consider follow up with your Primary Care Provider.   You have been scheduled for a colonoscopy. Please follow written instructions given to you at your visit today.  Please pick up your prep supplies at the pharmacy within the next 1-3 days. If you use inhalers (even only as needed), please bring them with you on the day of your procedure.  Please call lab work done at your job. If not please call us back and have to have repeat CBC and CMP in 4 weeks from the first lab drawn.  We have sent the following medications to your pharmacy for you to pick up at your convenience: Prednisone Imuran  Continue Lialda.  Call in 3 months to schedule office visit.  Thank you,  Dr. Lynann Bologna

## 2020-06-18 NOTE — Progress Notes (Signed)
IMPRESSION and PLAN:    #1. Left sided ulcerative colitis-Dx at age 40 on colon 58 in [place]. Was treated with mesalamine/azathioprine 75 mg PO QD (stopped AZA on his own as he got better). Last colon 11/2017- UC upto 45 cm (mid- desc colon) with mod activity on mesalamine and advised to resume AZA (didn't pick prescription). Moved back to [place]. With excerebration.  Plan: - Lialda 4.8g po qd to continue - Prednisone 30 mg p.o. once a day for 1 week, followed by 20 mg p.o. once a day for 2 weeks, then 10 mg p.o. once a day for 2 weeks and then stop. -Stool studies for GI Pathogen (includes C. Diff) and Calprotectin. -Restart imuran 50 mg po qd #30, 6 refills -Blood tests - CBC, CMP, CRP, TPMT level, TB gold and HBsAg. He wants to get it done at work. -Rpt CBC, CMP in 4 weeks. -Colon with miralax.  Discussed risks and benefits. -RTC 12 weeks.   HPI:    Jordan Greene is a 40 y.o. male  Been having increasing diarrhea since Nov 2021, worse to last 4 weeks Diarrhea 15-20/day, blood, watery Lower abdominal pain which is somewhat better now  Got in touch with [place] and has been started on prednisone 40 mg p.o. once a day for 2 weeks and is now down to 30 mg p.o. once a day.  His abdominal pain and diarrhea is better.  No fever chills or night sweats.  No recent antibiotics or nonsteroidals.  He has only been taking mesalamine 4.8 g once a day.  He did not pick up Imuran prescription  Denies having any significant nausea or vomiting.  No skin rash, joint pains or any eye changes.  Past Medical History:  Diagnosis Date  . Ulcerative colitis ([facility])     Current Outpatient Medications  Medication Sig Dispense Refill  . mesalamine (LIALDA) 1.2 g EC tablet Take 1 tablet (1.2 g total) by mouth 4 (four) times daily. Call [phone] to make an office visit for more refills 360 tablet 0  . predniSONE (DELTASONE) 10 MG tablet We have sent the following medications to your pharmacy for  you to pick up at your convenience: Prednisone 10mg  tablet 40mg  by mouth x 1 week. 30mg  by mouth x 1 week. 20mg  by mouth x 2 weeks. 10mg  by mouth x 2 weeks. 100 tablet 0  . Vitamin D, Ergocalciferol, (DRISDOL) 1.25 MG (50000 UT) CAPS capsule 1 capsule once a week.     No current facility-administered medications for this visit.    Past Surgical History:  Procedure Laterality Date  . ARTHROSCOPIC REPAIR ACL    . COLONOSCOPY  11/16/2017    Family History  Problem Relation Age of Onset  . Colon cancer Neg Hx   . Esophageal cancer Neg Hx   . Heart disease Neg Hx   . Colon polyps Neg Hx   . Rectal cancer Neg Hx   . Stomach cancer Neg Hx     Social History   Tobacco Use  . Smoking status: Never Smoker  . Smokeless tobacco: Never Used  Vaping Use  . Vaping Use: Never used  Substance Use Topics  . Alcohol use: No  . Drug use: No    Allergies  Allergen Reactions  . Other     Dairy-Milk Red Meat     Review of Systems: All systems reviewed and negative except where noted in HPI.    Physical Exam:  BP 112/78 (BP Location: Right Arm, Patient Position: Sitting, Cuff Size: Normal)   Pulse 68   Ht 5\' 8"  (1.727 m)   Wt 165 lb (74.8 kg)   BMI 25.09 kg/m   GENERAL:  Alert, oriented, cooperative, not in acute distress. PSYCH: :Pleasant, normal mood and affect. HEENT:  conjunctiva pink, mucous membranes moist, neck supple without masses. No jaundice. CARDIAC:  S1 S2 normal. No murmers. PULM: Normal respiratory effort, lungs CTA bilaterally, no wheezing. ABDOMEN: Inspection: No visible peristalsis, no abnormal pulsations, skin normal.  Palpation/percussion: Soft, mild tenderness on deep palpation, no rebound., nondistended, no rigidity, no abnormal dullness to percussion, no hepatosplenomegaly and no palpable abdominal masses.  Auscultation: Normal bowel sounds, no abdominal bruits. Rectal exam: Deferred SKIN:  turgor, no lesions seen. Musculoskeletal:  Normal muscle  tone, normal strength. NEURO: Alert and oriented x 3, no focal neurologic deficits.   Alilah Mcmeans,MD 06/18/2020, 8:37 AM   CC Dr 06/20/2020

## 2020-06-29 ENCOUNTER — Other Ambulatory Visit: Payer: Self-pay | Admitting: Gastroenterology

## 2020-07-18 ENCOUNTER — Other Ambulatory Visit: Payer: Self-pay | Admitting: Gastroenterology

## 2020-07-24 DIAGNOSIS — Z20822 Contact with and (suspected) exposure to covid-19: Secondary | ICD-10-CM | POA: Diagnosis not present

## 2020-07-30 ENCOUNTER — Encounter: Payer: Self-pay | Admitting: Gastroenterology

## 2020-07-30 DIAGNOSIS — K5190 Ulcerative colitis, unspecified, without complications: Secondary | ICD-10-CM | POA: Insufficient documentation

## 2020-08-02 ENCOUNTER — Telehealth: Payer: Self-pay | Admitting: Gastroenterology

## 2020-08-02 NOTE — Telephone Encounter (Signed)
Thanks for letting me know RG 

## 2020-08-02 NOTE — Telephone Encounter (Signed)
Called patient to remind him of his procedure on July 1st, he stated he will be out of town and will call back to reschedule.

## 2020-08-06 ENCOUNTER — Encounter: Payer: BC Managed Care – PPO | Admitting: Gastroenterology

## 2020-08-10 ENCOUNTER — Other Ambulatory Visit: Payer: Self-pay | Admitting: *Deleted

## 2020-08-10 DIAGNOSIS — K51919 Ulcerative colitis, unspecified with unspecified complications: Secondary | ICD-10-CM

## 2020-08-13 ENCOUNTER — Other Ambulatory Visit: Payer: Self-pay

## 2020-08-13 ENCOUNTER — Other Ambulatory Visit (INDEPENDENT_AMBULATORY_CARE_PROVIDER_SITE_OTHER): Payer: BC Managed Care – PPO

## 2020-08-13 DIAGNOSIS — K51919 Ulcerative colitis, unspecified with unspecified complications: Secondary | ICD-10-CM | POA: Diagnosis not present

## 2020-08-13 LAB — COMPREHENSIVE METABOLIC PANEL
ALT: 34 U/L (ref 0–53)
AST: 23 U/L (ref 0–37)
Albumin: 4.4 g/dL (ref 3.5–5.2)
Alkaline Phosphatase: 40 U/L (ref 39–117)
BUN: 7 mg/dL (ref 6–23)
CO2: 31 mEq/L (ref 19–32)
Calcium: 9.5 mg/dL (ref 8.4–10.5)
Chloride: 100 mEq/L (ref 96–112)
Creatinine, Ser: 0.97 mg/dL (ref 0.40–1.50)
GFR: 97.53 mL/min (ref >60.00)
Glucose, Bld: 106 mg/dL — ABNORMAL HIGH (ref 70–99)
Potassium: 4.4 mEq/L (ref 3.5–5.1)
Sodium: 137 mEq/L (ref 135–145)
Total Bilirubin: 0.6 mg/dL (ref 0.2–1.2)
Total Protein: 6.8 g/dL (ref 6.0–8.3)

## 2020-08-13 LAB — CBC WITH DIFFERENTIAL/PLATELET
Basophils Absolute: 0.1 10*3/uL (ref 0.0–0.1)
Basophils Relative: 1.1 % (ref 0.0–3.0)
Eosinophils Absolute: 0.4 10*3/uL (ref 0.0–0.7)
Eosinophils Relative: 7.8 % — ABNORMAL HIGH (ref 0.0–5.0)
HCT: 45.7 % (ref 39.0–52.0)
Hemoglobin: 15.6 g/dL (ref 13.0–17.0)
Lymphocytes Relative: 35.8 % (ref 12.0–46.0)
Lymphs Abs: 1.7 10*3/uL (ref 0.7–4.0)
MCHC: 34.2 g/dL (ref 30.0–36.0)
MCV: 91.8 fl (ref 78.0–100.0)
Monocytes Absolute: 0.4 10*3/uL (ref 0.1–1.0)
Monocytes Relative: 7.8 % (ref 3.0–12.0)
Neutro Abs: 2.3 10*3/uL (ref 1.4–7.7)
Neutrophils Relative %: 47.5 % (ref 43.0–77.0)
Platelets: 207 10*3/uL (ref 150.0–400.0)
RBC: 4.98 Mil/uL (ref 4.22–5.81)
RDW: 15.2 % (ref 11.5–15.5)
WBC: 4.8 10*3/uL (ref 4.0–10.5)

## 2020-08-28 NOTE — Telephone Encounter (Signed)
He has been informed regarding blood work Cannot add fasting lipid profile Need to get in touch with Dr. Otho Darner

## 2020-09-09 ENCOUNTER — Other Ambulatory Visit: Payer: Self-pay

## 2020-09-09 ENCOUNTER — Ambulatory Visit: Payer: BC Managed Care – PPO | Admitting: Cardiology

## 2020-09-09 VITALS — BP 118/76 | HR 61 | Ht 68.0 in | Wt 170.4 lb

## 2020-09-09 DIAGNOSIS — I209 Angina pectoris, unspecified: Secondary | ICD-10-CM | POA: Diagnosis not present

## 2020-09-09 DIAGNOSIS — Z8639 Personal history of other endocrine, nutritional and metabolic disease: Secondary | ICD-10-CM

## 2020-09-09 DIAGNOSIS — Z8349 Family history of other endocrine, nutritional and metabolic diseases: Secondary | ICD-10-CM

## 2020-09-09 DIAGNOSIS — K5100 Ulcerative (chronic) pancolitis without complications: Secondary | ICD-10-CM

## 2020-09-09 DIAGNOSIS — R079 Chest pain, unspecified: Secondary | ICD-10-CM | POA: Diagnosis not present

## 2020-09-09 DIAGNOSIS — E559 Vitamin D deficiency, unspecified: Secondary | ICD-10-CM

## 2020-09-09 HISTORY — DX: Angina pectoris, unspecified: I20.9

## 2020-09-09 NOTE — Patient Instructions (Addendum)
Medication Instructions:  Your physician recommends that you continue on your current medications as directed. Please refer to the Current Medication list given to you today.  *If you need a refill on your cardiac medications before your next appointment, please call your pharmacy*   Lab Work: BMET, CBC, TSH, LFT, Lipids, Vit D, Vit B12 If you have labs (blood work) drawn today and your tests are completely normal, you will receive your results only by: MyChart Message (if you have MyChart) OR A paper copy in the mail If you have any lab test that is abnormal or we need to change your treatment, we will call you to review the results.   Testing/Procedures:   Your cardiac CT will be scheduled at one of the below locations:   [facility] [address] [place], [place] [address] [phone]   If scheduled at [facility], please arrive at the [facility] main entrance (entrance A) of [facility] 30 minutes prior to test start time. Proceed to the [facility] Radiology Department (first floor) to check-in and test prep.  Please follow these instructions carefully (unless otherwise directed):  Hold all erectile dysfunction medications at least 3 days (72 hrs) prior to test.  On the Night Before the Test: Be sure to Drink plenty of water. Do not consume any caffeinated/decaffeinated beverages or chocolate 12 hours prior to your test. Do not take any antihistamines 12 hours prior to your test.  On the Day of the Test: Drink plenty of water until 1 hour prior to the test. Do not eat any food 4 hours prior to the test. You may take your regular medications prior to the test.  Take metoprolol (Lopressor) two hours prior to test.      After the Test: Drink plenty of water. After receiving IV contrast, you may experience a mild flushed feeling. This is normal. On occasion, you may experience a mild rash up to 24 hours after the test. This is not  dangerous. If this occurs, you can take Benadryl 25 mg and increase your fluid intake. If you experience trouble breathing, this can be serious. If it is severe call 911 IMMEDIATELY. If it is mild, please call our office. If you take any of these medications: Glipizide/Metformin, Avandament, Glucavance, please do not take 48 hours after completing test unless otherwise instructed.  Please allow 2-4 weeks for scheduling of routine cardiac CTs. Some insurance companies require a pre-authorization which may delay scheduling of this test.   For non-scheduling related questions, please contact the cardiac imaging nurse navigator should you have any questions/concerns: Rockwell Alexandria, Cardiac Imaging Nurse Navigator Larey Brick, Cardiac Imaging Nurse Navigator [facility] Direct Office Dial: [phone]   For scheduling needs, including cancellations and rescheduling, please call [place], [phone].    Follow-Up: At [facility], you and your health needs are our priority.  As part of our continuing mission to provide you with exceptional heart care, we have created designated Provider Care Teams.  These Care Teams include your primary Cardiologist (physician) and Advanced Practice Providers (APPs -  Physician Assistants and Nurse Practitioners) who all work together to provide you with the care you need, when you need it.  We recommend signing up for the patient portal called "MyChart".  Sign up information is provided on this After Visit Summary.  MyChart is used to connect with patients for Virtual Visits (Telemedicine).  Patients are able to view lab/test results, encounter notes,  upcoming appointments, etc.  Non-urgent messages can be sent to your provider as well.   To learn more about what you can do with MyChart, go to [email or web address].    Your next appointment:   6 month(s)  The format for your next appointment:   In Person  Provider:   Belva Crome, MD   Other Instructions

## 2020-09-09 NOTE — Progress Notes (Signed)
Cardiology Office Note:    Date:  09/09/2020   ID:  Jordan Greene, DOB 1980/03/27, MRN [id number]  PCP:  Lucianne Lei, MD  Cardiologist:  Garwin Brothers, MD   Referring MD: Lucianne Lei, MD    ASSESSMENT:    1. Ulcerative pancolitis without complication (HCC)   2. Angina pectoris (HCC)    PLAN:    In order of problems listed above:  Primary prevention stressed with the patient.  Importance of compliance with diet medication stressed any vocalized understanding. Angina pectoris: Patient symptoms are very concerning.  He has symptoms reproducible on exertion.  Metoprolol has relieved some of his symptoms.  He also feels that he has Nitroglycerin handy just to feel safe.  Patient is overall curtail his exercise and exertion because of these reproducible symptoms and they are concerning.  I discussed noninvasive and invasive evaluations with him.  He opted for coronary CT angiography with FFR.  This will also help him get a good idea of his coronary anatomy and he plans to begin an exercise program after this.  Procedure, benefits and potential risks explained to the patient and he vocalized understanding and questions were answered to his satisfaction. Medical dyslipidemia: Diet was emphasized.  Once the aforementioned tests were done he will be advised about diet and exercise and losing weight.  I had a discussion with him about this and told him not to start an exercise program.  Told him to in view of the aforementioned symptoms. He will have complete blood work today.  He gives history of vitamin D deficiency and also B12 deficiency we will check this today. Patient will be seen in follow-up appointment in 6 months or earlier if the patient has any concerns    Medication Adjustments/Labs and Tests Ordered: Current medicines are reviewed at length with the patient today.  Concerns regarding medicines are outlined above.  No orders of the defined types were placed in this  encounter.  No orders of the defined types were placed in this encounter.    History of Present Illness:    Jordan Greene is a 40 y.o. male who is being seen today for the evaluation of chest pain.  Patient is a pleasant 40 year old male.  He has no significant past medical history from a cardiovascular standpoint.  He has mildly elevated lipids in the past.  He mentions to me that his blood sugars were a little off but not to the point that he had a diagnosis of diabetes.  The patient mentions to me that he has been on steroids for ulcerative colitis in the past on and off.  He has been experiencing chest tightness on exertion.  Many at times when he exerts he has to stop as he has chest tightness radiating to the left arm.  He also feels dizzy at 6 times.  This is of concern to him.  His primary care therefore initiated him on beta-blockers metoprolol 25 mg twice daily and aspirin.  He has discontinued aspirin because of rectal bleeding as he feels that aspirin clears up his ulcerative colitis.  At the time of my evaluation, the patient is alert awake oriented and in no distress.  Past Medical History:  Diagnosis Date   Chest pain    Ulcerative colitis [facility])     Past Surgical History:  Procedure Laterality Date   ARTHROSCOPIC REPAIR ACL     COLONOSCOPY  11/16/2017    Current Medications: Current Meds  Medication Sig   azaTHIOprine (  IMURAN) 50 MG tablet Take 1 tablet (50 mg total) by mouth daily.   ergocalciferol (VITAMIN D2) 1.25 MG (50000 UT) capsule Take 50,000 Units by mouth once a week.   mesalamine (LIALDA) 1.2 g EC tablet TAKE 1 TABLET (1.2 G TOTAL) BY MOUTH 4 (FOUR) TIMES DAILY. CALL [phone] TO MAKE AN OFFICE VISIT FOR MORE REFILLS   metoprolol tartrate (LOPRESSOR) 25 MG tablet Take 25 mg by mouth 2 (two) times daily.   nitroGLYCERIN (NITROSTAT) 0.4 MG SL tablet Place 0.4 mg under the tongue as needed for chest pain.     Allergies:   Other   Social History    Socioeconomic History   Marital status: Married    Spouse name: Not on file   Number of children: 2   Years of education: Not on file   Highest education level: Not on file  Occupational History   Not on file  Tobacco Use   Smoking status: Never   Smokeless tobacco: Never  Vaping Use   Vaping Use: Never used  Substance and Sexual Activity   Alcohol use: No   Drug use: No   Sexual activity: Never  Other Topics Concern   Not on file  Social History Narrative   Not on file   Social Determinants of Health   Financial Resource Strain: Not on file  Food Insecurity: Not on file  Transportation Needs: Not on file  Physical Activity: Not on file  Stress: Not on file  Social Connections: Not on file     Family History: The patient's family history is negative for Colon cancer, Esophageal cancer, Heart disease, Colon polyps, Rectal cancer, and Stomach cancer.  ROS:   Please see the history of present illness.    All other systems reviewed and are negative.  EKGs/Labs/Other Studies Reviewed:    The following studies were reviewed today: EKG reveals sinus rhythm and nonspecific ST-T changes   Recent Labs: 08/13/2020: ALT 34; BUN 7; Creatinine, Ser 0.97; Hemoglobin 15.6; Platelets 207.0; Potassium 4.4; Sodium 137  Recent Lipid Panel No results found for: CHOL, TRIG, HDL, CHOLHDL, VLDL, LDLCALC, LDLDIRECT  Physical Exam:    VS:  BP 118/76   Pulse 61   Ht 5\' 8"  (1.727 m)   Wt 170 lb 6.4 oz (77.3 kg)   SpO2 99%   BMI 25.91 kg/m     Wt Readings from Last 3 Encounters:  09/09/20 170 lb 6.4 oz (77.3 kg)  06/18/20 165 lb (74.8 kg)  11/27/18 159 lb 2 oz (72.2 kg)     GEN: Patient is in no acute distress HEENT: Normal NECK: No JVD; No carotid bruits LYMPHATICS: No lymphadenopathy CARDIAC: S1 S2 regular, 2/6 systolic murmur at the apex. RESPIRATORY:  Clear to auscultation without rales, wheezing or rhonchi  ABDOMEN: Soft, non-tender, non-distended MUSCULOSKELETAL:   No edema; No deformity  SKIN: Warm and dry NEUROLOGIC:  Alert and oriented x 3 PSYCHIATRIC:  Normal affect    Signed, 11/29/18, MD  09/09/2020 12:56 PM    [facility]

## 2020-09-10 ENCOUNTER — Telehealth: Payer: Self-pay

## 2020-09-10 LAB — VITAMIN B12: Vitamin B-12: 297 pg/mL (ref 232–1245)

## 2020-09-10 LAB — BASIC METABOLIC PANEL
BUN/Creatinine Ratio: 6 — ABNORMAL LOW (ref 9–20)
BUN: 6 mg/dL (ref 6–24)
CO2: 25 mmol/L (ref 20–29)
Calcium: 9.4 mg/dL (ref 8.7–10.2)
Chloride: 100 mmol/L (ref 96–106)
Creatinine, Ser: 1.04 mg/dL (ref 0.76–1.27)
Glucose: 81 mg/dL (ref 65–99)
Potassium: 4.5 mmol/L (ref 3.5–5.2)
Sodium: 142 mmol/L (ref 134–144)
eGFR: 93 mL/min/{1.73_m2} (ref >59)

## 2020-09-10 LAB — CBC
Hematocrit: 49.2 % (ref 37.5–51.0)
Hemoglobin: 16.7 g/dL (ref 13.0–17.7)
MCH: 30.4 pg (ref 26.6–33.0)
MCHC: 33.9 g/dL (ref 31.5–35.7)
MCV: 90 fL (ref 79–97)
Platelets: 224 10*3/uL (ref 150–450)
RBC: 5.49 x10E6/uL (ref 4.14–5.80)
RDW: 13.9 % (ref 11.6–15.4)
WBC: 5.6 10*3/uL (ref 3.4–10.8)

## 2020-09-10 LAB — LIPID PANEL
Chol/HDL Ratio: 4 ratio (ref 0.0–5.0)
Cholesterol, Total: 174 mg/dL (ref 100–199)
HDL: 44 mg/dL (ref >39)
LDL Chol Calc (NIH): 113 mg/dL — ABNORMAL HIGH (ref 0–99)
Triglycerides: 93 mg/dL (ref 0–149)
VLDL Cholesterol Cal: 17 mg/dL (ref 5–40)

## 2020-09-10 LAB — HEPATIC FUNCTION PANEL
ALT: 21 IU/L (ref 0–44)
AST: 23 IU/L (ref 0–40)
Albumin: 4.5 g/dL (ref 4.0–5.0)
Alkaline Phosphatase: 49 IU/L (ref 44–121)
Bilirubin Total: 0.5 mg/dL (ref 0.0–1.2)
Bilirubin, Direct: 0.15 mg/dL (ref 0.00–0.40)
Total Protein: 6.7 g/dL (ref 6.0–8.5)

## 2020-09-10 LAB — VITAMIN D 25 HYDROXY (VIT D DEFICIENCY, FRACTURES): Vit D, 25-Hydroxy: 42.8 ng/mL (ref 30.0–100.0)

## 2020-09-10 LAB — TSH: TSH: 1.43 u[IU]/mL (ref 0.450–4.500)

## 2020-09-10 NOTE — Telephone Encounter (Signed)
-----   Message from Rajan R Revankar, MD sent at 09/10/2020 12:17 PM EDT ----- The results of the study is unremarkable. Please inform patient. I will discuss in detail at next appointment. Cc  primary care/referring physician Rajan R Revankar, MD 09/10/2020 12:17 PM  

## 2020-09-10 NOTE — Telephone Encounter (Signed)
Patient notified of results. Copy sent to PCP 

## 2020-09-16 ENCOUNTER — Telehealth (HOSPITAL_COMMUNITY): Payer: Self-pay | Admitting: *Deleted

## 2020-09-16 NOTE — Telephone Encounter (Signed)
Reaching out to patient to offer assistance regarding upcoming cardiac imaging study; pt verbalizes understanding of appt date/time, parking situation and where to check in, pre-test NPO status and medications ordered, and verified current allergies; name and call back number provided for further questions should they arise  Larey Brick RN Navigator Cardiac Imaging [facility] [phone] office [phone] cell  Patient to take his daily 25mg  metoprolol tartrate 2 hours prior to cardiac CT scan.

## 2020-09-20 ENCOUNTER — Ambulatory Visit (HOSPITAL_COMMUNITY): Payer: BC Managed Care – PPO

## 2020-09-20 ENCOUNTER — Ambulatory Visit (HOSPITAL_COMMUNITY)
Admission: RE | Admit: 2020-09-20 | Discharge: 2020-09-20 | Disposition: A | Discharge status: Home / Self Care | Payer: BC Managed Care – PPO | Source: Ambulatory Visit | Attending: Cardiology | Admitting: Cardiology

## 2020-09-20 ENCOUNTER — Other Ambulatory Visit: Payer: Self-pay

## 2020-09-20 DIAGNOSIS — R079 Chest pain, unspecified: Secondary | ICD-10-CM | POA: Diagnosis not present

## 2020-09-20 IMAGING — CT CT HEART MORP W/ CTA COR W/ SCORE W/ CA W/CM &/OR W/O CM
4 of 7 series · 8 of 20 positions shown, 9 images · IV contrast (APPLIED)
Comparison: None.
COMPARISON: None.

Addendum:
EXAM:
OVER-READ INTERPRETATION  CT CHEST

The following report is an over-read performed by radiologist Dr.
Morlon Mbee [REDACTED] on 09/20/2020. This over-read
does not include interpretation of cardiac or coronary anatomy or
pathology. The coronary CTA interpretation by the cardiologist is
attached.
CLINICAL DATA: Chest pain
Cardiac/Coronary CTA
TECHNIQUE: A non-contrast, gated CT scan was obtained with axial slices of 3 mm
through the heart for calcium scoring. Calcium scoring was performed
using the Agatston method. A 100 kV prospective, gated, contrast
cardiac scan was obtained. Gantry rotation speed was 250 msecs and
collimation was 0.6 mm. Two sublingual nitroglycerin tablets (0.8
mg) were given. The 3D data set was reconstructed in 5% intervals of
the 35-75% of the R-R cycle. Diastolic phases were analyzed on a
dedicated workstation using MPR, MIP, and VRT modes. The patient
received 95 cc of contrast.

[Series 6: best diast 71 % · axial · 0.39mm/px · z∈[+1150,+1190]mm · 2 of 308 slices shown, 3 images]
[im 103/308  vessel]
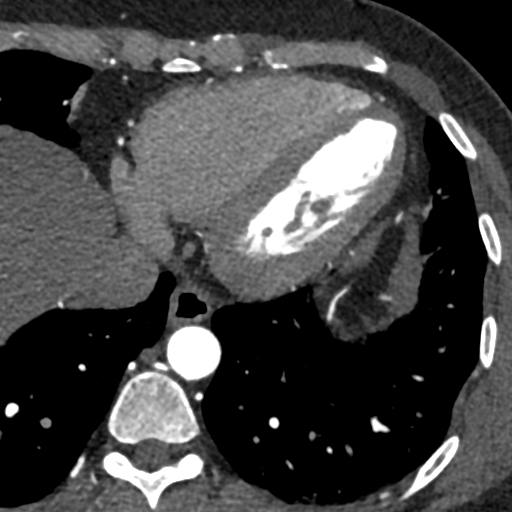
[im 103/308  lung]
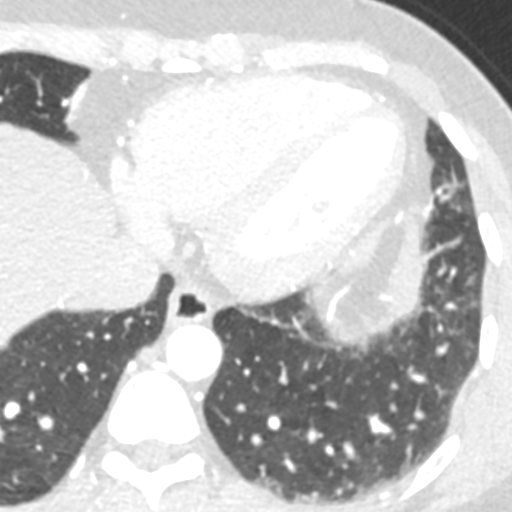
[im 205/308  vessel]
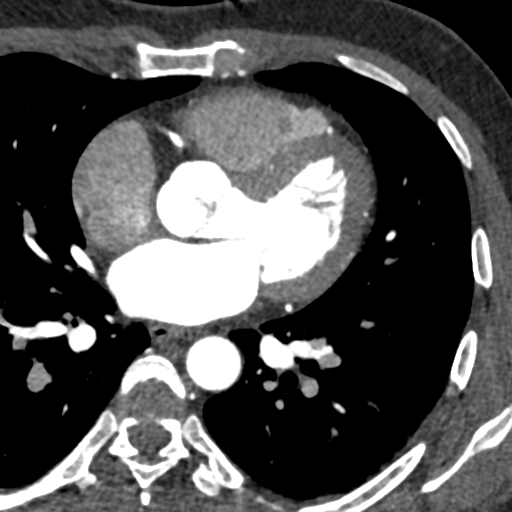

[Series 7: best syst 32 % · axial · 0.39mm/px · z∈[+1150,+1190]mm · 2 of 308 slices shown]
[im 103/308  vessel]
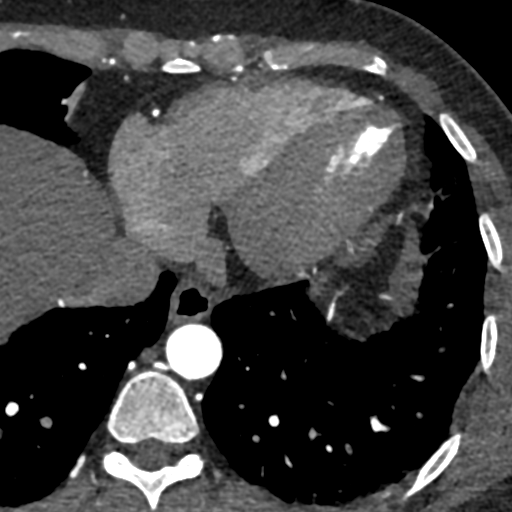
[im 205/308  vessel]
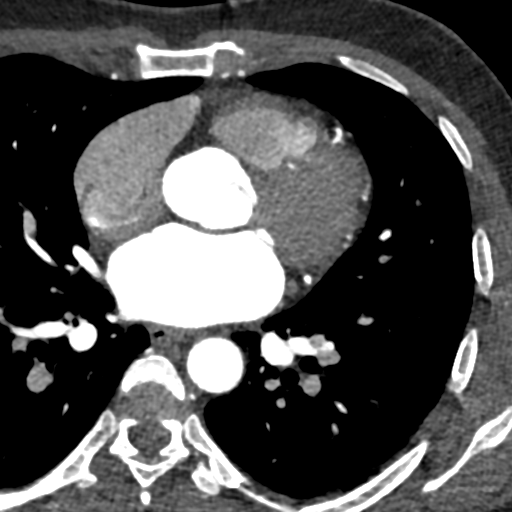

[Series 8: ts diast sharp 71 % · axial · 0.39mm/px · z∈[+1150,+1190]mm · 2 of 308 slices shown]
[im 103/308  lung]
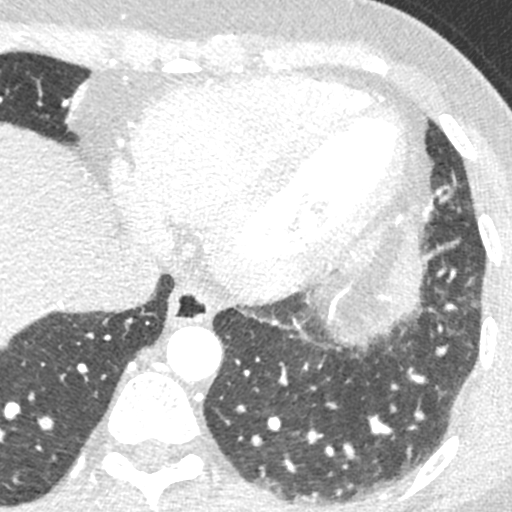
[im 205/308  lung]
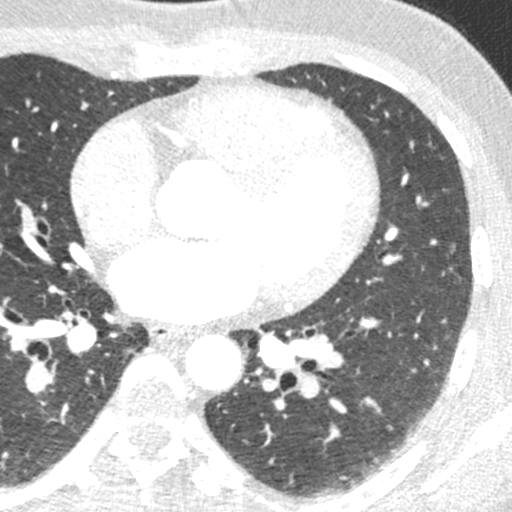

[Series 9: ts syst sharp 32 % · axial · 0.39mm/px · z∈[+1150,+1190]mm · 2 of 308 slices shown]
[im 103/308  lung]
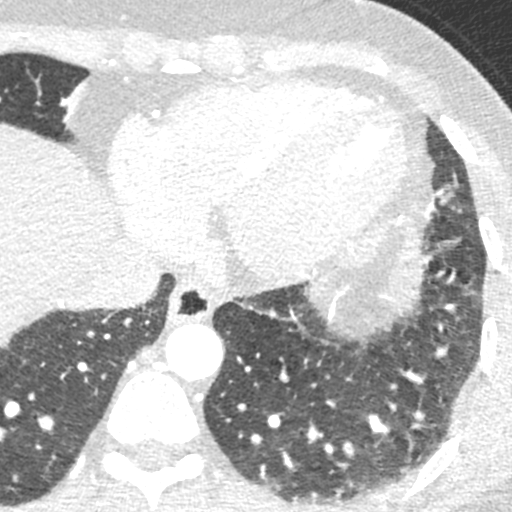
[im 205/308  lung]
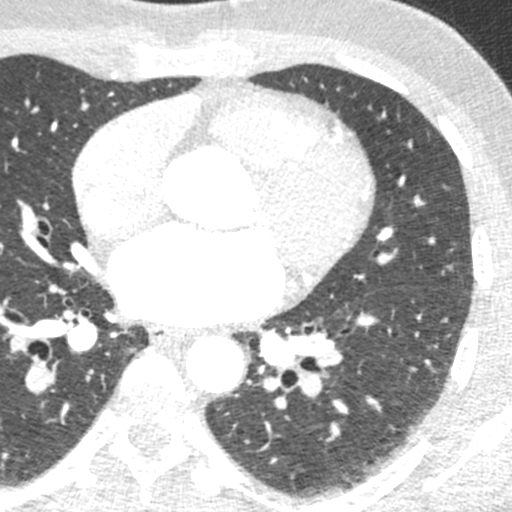

[8 of 20 positions shown; findings below may reference images not displayed]

FINDINGS: Vascular: Heart is normal size.  Visualized aorta normal caliber.

Mediastinum/Nodes: No adenopathy

Lungs/Pleura: No confluent opacities or effusions.

Upper Abdomen: Imaging into the upper abdomen demonstrates no acute
findings.

Musculoskeletal: Chest wall soft tissues are unremarkable. No acute
bony abnormality.
IMPRESSION: No acute or significant extracardiac abnormality.
FINDINGS: Image quality: Excellent.

Noise artifact is: Limited.

Coronary Arteries:  Normal coronary origin.  Right dominance.

Left main: The left main is a large caliber vessel with a normal
take off from the left coronary cusp that trifurcates into a LAD,
LCX, and ramus intermedius. There is no plaque or stenosis.

Left anterior descending artery: The LAD is patent without evidence
of plaque or stenosis. The LAD gives off 1 patent diagonal branch.

Ramus intermedius: Patent with no evidence of plaque or stenosis.

Left circumflex artery: The LCX is non-dominant and patent with no
evidence of plaque or stenosis. The LCX gives off 1 patent obtuse
marginal branch.

Right coronary artery: The RCA is dominant with normal take off from
the right coronary cusp. There is no evidence of plaque or stenosis.
The RCA terminates as a PDA and right posterolateral branch without
evidence of plaque or stenosis.

Right Atrium: Right atrial size is within normal limits.

Right Ventricle: The right ventricular cavity is within normal
limits.

Left Atrium: Left atrial size is normal in size with no left atrial
appendage filling defect.

Left Ventricle: The ventricular cavity size is within normal limits.
There are no stigmata of prior infarction. There is no abnormal
filling defect.

Pulmonary arteries: Normal in size without proximal filling defect.

Pulmonary veins: Normal pulmonary venous drainage.

Pericardium: Normal thickness with no significant effusion or
calcium present.

Cardiac valves: The aortic valve is trileaflet without significant
calcification. The mitral valve is normal structure without
significant calcification.

Aorta: Normal caliber with no significant disease.

Extra-cardiac findings: See attached radiology report for
non-cardiac structures.
IMPRESSION: 1. Coronary calcium score of 0.

2. Normal coronary origin with right dominance.

3. Normal coronary arteries.

RECOMMENDATIONS:
1. CAD-RADS 0: No evidence of CAD (0%). Consider non-atherosclerotic
causes of chest pain.

*** End of Addendum ***
EXAM:
OVER-READ INTERPRETATION  CT CHEST

The following report is an over-read performed by radiologist Dr.
Morlon Mbee [REDACTED] on 09/20/2020. This over-read
does not include interpretation of cardiac or coronary anatomy or
pathology. The coronary CTA interpretation by the cardiologist is
attached.
FINDINGS: Vascular: Heart is normal size.  Visualized aorta normal caliber.

Mediastinum/Nodes: No adenopathy

Lungs/Pleura: No confluent opacities or effusions.

Upper Abdomen: Imaging into the upper abdomen demonstrates no acute
findings.

Musculoskeletal: Chest wall soft tissues are unremarkable. No acute
bony abnormality.
IMPRESSION: No acute or significant extracardiac abnormality.

## 2020-09-20 MED ORDER — NITROGLYCERIN 0.4 MG SL SUBL
SUBLINGUAL_TABLET | SUBLINGUAL | Status: AC
  Filled 2020-09-20: qty 2

## 2020-09-20 MED ORDER — NITROGLYCERIN 0.4 MG SL SUBL
0.8000 mg | SUBLINGUAL_TABLET | Freq: Once | SUBLINGUAL | Status: AC
  Administered 2020-09-20: 0.8 mg via SUBLINGUAL

## 2020-09-20 MED ORDER — IOHEXOL 350 MG/ML SOLN
95.0000 mL | Freq: Once | INTRAVENOUS | Status: AC | PRN
  Administered 2020-09-20: 95 mL via INTRAVENOUS

## 2020-09-22 ENCOUNTER — Other Ambulatory Visit: Payer: Self-pay | Admitting: Gastroenterology

## 2020-09-30 ENCOUNTER — Other Ambulatory Visit: Payer: Self-pay

## 2020-10-04 ENCOUNTER — Other Ambulatory Visit: Payer: Self-pay

## 2020-10-04 MED ORDER — MESALAMINE 1.2 G PO TBEC: 1.2000 g | DELAYED_RELEASE_TABLET | Freq: Four times a day (QID) | ORAL | 1 refills | Status: DC

## 2020-10-04 MED ORDER — MESALAMINE 1.2 G PO TBEC: 1.2000 g | DELAYED_RELEASE_TABLET | Freq: Four times a day (QID) | ORAL | 3 refills | Status: DC

## 2021-03-17 ENCOUNTER — Ambulatory Visit: Payer: BC Managed Care – PPO | Admitting: Cardiology

## 2021-04-10 ENCOUNTER — Other Ambulatory Visit: Payer: Self-pay | Admitting: Gastroenterology

## 2021-07-04 ENCOUNTER — Other Ambulatory Visit: Payer: Self-pay | Admitting: Gastroenterology

## 2021-10-05 ENCOUNTER — Other Ambulatory Visit: Payer: Self-pay | Admitting: Gastroenterology

## 2021-10-10 ENCOUNTER — Other Ambulatory Visit: Payer: Self-pay | Admitting: Gastroenterology

## 2022-01-08 ENCOUNTER — Other Ambulatory Visit: Payer: Self-pay | Admitting: Gastroenterology

## 2022-01-20 ENCOUNTER — Other Ambulatory Visit: Payer: Self-pay | Admitting: Gastroenterology

## 2022-01-20 MED ORDER — AZATHIOPRINE 50 MG PO TABS: 50.0000 mg | ORAL_TABLET | Freq: Every day | ORAL | 2 refills | Status: DC

## 2022-10-16 ENCOUNTER — Other Ambulatory Visit: Payer: Self-pay | Admitting: Gastroenterology

## 2022-12-29 ENCOUNTER — Encounter: Payer: Self-pay | Admitting: Gastroenterology

## 2022-12-29 ENCOUNTER — Other Ambulatory Visit (INDEPENDENT_AMBULATORY_CARE_PROVIDER_SITE_OTHER): Payer: BC Managed Care – PPO

## 2022-12-29 ENCOUNTER — Ambulatory Visit: Payer: BC Managed Care – PPO | Admitting: Gastroenterology

## 2022-12-29 VITALS — BP 100/70 | HR 76 | Ht 66.75 in | Wt 169.1 lb

## 2022-12-29 DIAGNOSIS — E739 Lactose intolerance, unspecified: Secondary | ICD-10-CM | POA: Diagnosis not present

## 2022-12-29 DIAGNOSIS — K51019 Ulcerative (chronic) pancolitis with unspecified complications: Secondary | ICD-10-CM

## 2022-12-29 LAB — CBC WITH DIFFERENTIAL/PLATELET
Basophils Absolute: 0.1 10*3/uL (ref 0.0–0.1)
Basophils Relative: 1.1 % (ref 0.0–3.0)
Eosinophils Absolute: 0.3 10*3/uL (ref 0.0–0.7)
Eosinophils Relative: 5.5 % — ABNORMAL HIGH (ref 0.0–5.0)
HCT: 47.8 % (ref 39.0–52.0)
Hemoglobin: 16.1 g/dL (ref 13.0–17.0)
Lymphocytes Relative: 15.4 % (ref 12.0–46.0)
Lymphs Abs: 0.9 10*3/uL (ref 0.7–4.0)
MCHC: 33.6 g/dL (ref 30.0–36.0)
MCV: 92.2 fL (ref 78.0–100.0)
Monocytes Absolute: 0.5 10*3/uL (ref 0.1–1.0)
Monocytes Relative: 8.4 % (ref 3.0–12.0)
Neutro Abs: 4 10*3/uL (ref 1.4–7.7)
Neutrophils Relative %: 69.6 % (ref 43.0–77.0)
Platelets: 184 10*3/uL (ref 150.0–400.0)
RBC: 5.19 Mil/uL (ref 4.22–5.81)
RDW: 14.5 % (ref 11.5–15.5)
WBC: 5.8 10*3/uL (ref 4.0–10.5)

## 2022-12-29 LAB — COMPREHENSIVE METABOLIC PANEL
ALT: 11 U/L (ref 0–53)
AST: 14 U/L (ref 0–37)
Albumin: 4.4 g/dL (ref 3.5–5.2)
Alkaline Phosphatase: 49 U/L (ref 39–117)
BUN: 9 mg/dL (ref 6–23)
CO2: 27 meq/L (ref 19–32)
Calcium: 9.1 mg/dL (ref 8.4–10.5)
Chloride: 104 meq/L (ref 96–112)
Creatinine, Ser: 1.01 mg/dL (ref 0.40–1.50)
GFR: 91.38 mL/min (ref >60.00)
Glucose, Bld: 96 mg/dL (ref 70–99)
Potassium: 4.2 meq/L (ref 3.5–5.1)
Sodium: 140 meq/L (ref 135–145)
Total Bilirubin: 0.6 mg/dL (ref 0.2–1.2)
Total Protein: 7 g/dL (ref 6.0–8.3)

## 2022-12-29 LAB — C-REACTIVE PROTEIN: CRP: <1 mg/dL (ref 0.5–20.0)

## 2022-12-29 MED ORDER — AZATHIOPRINE 50 MG PO TABS: 50.0000 mg | ORAL_TABLET | Freq: Every day | ORAL | 4 refills | Status: DC

## 2022-12-29 MED ORDER — MESALAMINE 1.2 G PO TBEC: 1.2000 g | DELAYED_RELEASE_TABLET | Freq: Four times a day (QID) | ORAL | 4 refills | Status: DC

## 2022-12-29 NOTE — Progress Notes (Signed)
IMPRESSION and PLAN:    #1. Left sided ulcerative colitis-Dx at age 42 on colon 82 in [place]. Was treated with mesalamine/azathioprine 75 mg PO QD (stopped AZA on his own as he got better). Last colon 11/2017- UC upto 45 cm (mid- desc colon)-resumed AZA at 50mg  every day 11/2017.  #2. Lactose intol.  Plan: -Lialda 4.8g po qd to continue -CBC, CMP, CRP, TB gold and HBsAg -Continue imuran 50 mg po qd #30, 6 refills -Recommend colon with miralax to assess activity.  He would like to get it done from [place] as he is visiting [place] Jan 2025.  He is considering Entyvio thereafter, and stopping Imuran/Lialda. -FU march 2025.    HPI:    Jordan Greene is a 42 y.o. male  FU Doing much better on Imuran 50 mg p.o. daily-almost 5 years Would like to get off and try Entyvio (heard from his family physician's back home)  No nausea, vomiting, heartburn, regurgitation, odynophagia or dysphagia.  No significant diarrhea or constipation.  No melena or hematochezia. No unintentional weight loss. No abdominal pain.  He is due for repeat colonoscopy.  He would like to get it done from [place].  Tolerating medicines very well otherwise.  No complaints.  Past Medical History:  Diagnosis Date   Angina pectoris (HCC) 09/09/2020   Chest pain    Ulcerative colitis (HCC)     Current Outpatient Medications  Medication Sig Dispense Refill   azaTHIOprine (IMURAN) 50 MG tablet Take 1 tablet (50 mg total) by mouth daily. 90 tablet 0   MAGNESIUM PO Take 1 tablet by mouth daily.     mesalamine (LIALDA) 1.2 g EC tablet TAKE 1 TABLET (1.2 G TOTAL) BY MOUTH 4 (FOUR) TIMES DAILY. 360 tablet 0   Multiple Vitamin (MULTIVITAMIN) capsule Take 1 capsule by mouth daily.     nitroGLYCERIN (NITROSTAT) 0.4 MG SL tablet Place 0.4 mg under the tongue as needed for chest pain. (Patient not taking: Reported on 12/29/2022)     No current facility-administered medications for this visit.    Past Surgical History:   Procedure Laterality Date   ARTHROSCOPIC REPAIR ACL     COLONOSCOPY  11/16/2017    Family History  Problem Relation Age of Onset   Colon cancer Neg Hx    Esophageal cancer Neg Hx    Heart disease Neg Hx    Colon polyps Neg Hx    Rectal cancer Neg Hx    Stomach cancer Neg Hx     Social History   Tobacco Use   Smoking status: Never   Smokeless tobacco: Never  Vaping Use   Vaping status: Never Used  Substance Use Topics   Alcohol use: No   Drug use: No    Allergies  Allergen Reactions   Beef Allergy     Other Reaction(s): GI Intolerance   Milk Protein Nausea Only    Other Reaction(s): GI Intolerance   Other     Dairy-Milk Red Meat     Review of Systems: All systems reviewed and negative except where noted in HPI.    Physical Exam:     BP 100/70 (BP Location: Left Arm, Patient Position: Sitting, Cuff Size: Normal)   Pulse 76   Ht 5' 6.75" (1.695 m) Comment: height measured without shoes  Wt 169 lb 2 oz (76.7 kg)   BMI 26.69 kg/m   GENERAL:  Alert, oriented, cooperative, not in acute distress. PSYCH: :Pleasant, normal mood and affect. HEENT:  conjunctiva pink, mucous membranes moist, neck supple without masses. No jaundice. CARDIAC:  S1 S2 normal. No murmers. PULM: Normal respiratory effort, lungs CTA bilaterally, no wheezing. ABDOMEN: Inspection: No visible peristalsis, no abnormal pulsations, skin normal.  Palpation/percussion: Soft, mild tenderness on deep palpation, no rebound., nondistended, no rigidity, no abnormal dullness to percussion, no hepatosplenomegaly and no palpable abdominal masses.  Auscultation: Normal bowel sounds, no abdominal bruits. Rectal exam: Deferred SKIN:  turgor, no lesions seen. Musculoskeletal:  Normal muscle tone, normal strength. NEURO: Alert and oriented x 3, no focal neurologic deficits.   Gwendolyn Mclees,MD 12/29/2022, 8:49 AM   CC Dr Mathis Bud

## 2022-12-29 NOTE — Patient Instructions (Addendum)
_______________________________________________________  If your blood pressure at your visit was 140/90 or greater, please contact your primary care physician to follow up on this.  _______________________________________________________  If you are age 43 or older, your body mass index should be between 23-30. Your Body mass index is 26.69 kg/m. If this is out of the aforementioned range listed, please consider follow up with your Primary Care Provider.  If you are age 23 or younger, your body mass index should be between 19-25. Your Body mass index is 26.69 kg/m. If this is out of the aformentioned range listed, please consider follow up with your Primary Care Provider.   ________________________________________________________  The [place] [facility] providers would like to encourage you to use [facility] to communicate with providers for non-urgent requests or questions.  Due to long hold times on the telephone, sending your provider a message by [facility] may be a faster and more efficient way to get a response.  Please allow 48 business hours for a response.  Please remember that this is for non-urgent requests.  _______________________________________________________  Your provider has requested that you go to the basement level for lab work before leaving today. Press "B" on the elevator. The lab is located at the first door on the left as you exit the elevator.  Restart Imuran 50mg  daily. Continue Lialda.   Please follow up in 3 months. Give [place] a call at [phone] to schedule an appointment. Please bring colonoscopy report and path with you  the next time you come for your appointment   Thank you,  Dr. Lynann Bologna

## 2022-12-30 LAB — HEPATITIS B SURFACE ANTIGEN: Hepatitis B Surface Ag: NONREACTIVE

## 2023-01-02 LAB — QUANTIFERON-TB GOLD PLUS
Mitogen-NIL: 3.7 [IU]/mL
NIL: 0.07 [IU]/mL
QuantiFERON-TB Gold Plus: NEGATIVE
TB1-NIL: 0 [IU]/mL
TB2-NIL: 0 [IU]/mL

## 2023-03-05 ENCOUNTER — Encounter: Payer: Self-pay | Admitting: Gastroenterology

## 2024-01-25 ENCOUNTER — Other Ambulatory Visit: Payer: Self-pay | Admitting: Gastroenterology

## 2024-01-28 ENCOUNTER — Telehealth: Payer: Self-pay | Admitting: Gastroenterology

## 2024-01-28 MED ORDER — AZATHIOPRINE 50 MG PO TABS: 50.0000 mg | ORAL_TABLET | Freq: Every day | ORAL | 0 refills | Status: DC

## 2024-01-28 MED ORDER — MESALAMINE 1.2 G PO TBEC: 1.2000 g | DELAYED_RELEASE_TABLET | Freq: Four times a day (QID) | ORAL | 0 refills | Status: DC

## 2024-01-28 NOTE — Telephone Encounter (Signed)
 Done

## 2024-01-28 NOTE — Telephone Encounter (Signed)
 Inbound call from patient stating he needs medications  azaTHIOprine  and mesalamine  refilled. Patient is scheduled for a follow up on 03/12/24 Please advise  Thank you

## 2024-02-01 ENCOUNTER — Other Ambulatory Visit: Payer: Self-pay

## 2024-02-01 ENCOUNTER — Encounter: Payer: Self-pay | Admitting: Gastroenterology

## 2024-02-01 DIAGNOSIS — R197 Diarrhea, unspecified: Secondary | ICD-10-CM

## 2024-02-01 DIAGNOSIS — R109 Unspecified abdominal pain: Secondary | ICD-10-CM

## 2024-02-01 DIAGNOSIS — Z8719 Personal history of other diseases of the digestive system: Secondary | ICD-10-CM

## 2024-02-01 MED ORDER — PREDNISONE 10 MG PO TABS: ORAL_TABLET | ORAL | 0 refills | Status: AC

## 2024-02-15 ENCOUNTER — Ambulatory Visit: Admitting: Gastroenterology

## 2024-03-12 ENCOUNTER — Other Ambulatory Visit: Payer: Self-pay | Admitting: Gastroenterology

## 2024-03-12 ENCOUNTER — Encounter: Payer: Self-pay | Admitting: Gastroenterology

## 2024-03-12 ENCOUNTER — Ambulatory Visit: Admitting: Gastroenterology

## 2024-03-12 VITALS — BP 110/64 | HR 74 | Ht 66.5 in | Wt 171.2 lb

## 2024-03-12 DIAGNOSIS — E739 Lactose intolerance, unspecified: Secondary | ICD-10-CM

## 2024-03-12 DIAGNOSIS — K5190 Ulcerative colitis, unspecified, without complications: Secondary | ICD-10-CM

## 2024-03-12 DIAGNOSIS — Z1211 Encounter for screening for malignant neoplasm of colon: Secondary | ICD-10-CM

## 2024-03-12 DIAGNOSIS — Z8719 Personal history of other diseases of the digestive system: Secondary | ICD-10-CM

## 2024-03-12 DIAGNOSIS — K648 Other hemorrhoids: Secondary | ICD-10-CM

## 2024-03-12 MED ORDER — AZATHIOPRINE 50 MG PO TABS: 50.0000 mg | ORAL_TABLET | Freq: Every day | ORAL | 6 refills | Status: AC

## 2024-03-12 MED ORDER — FLUTICASONE PROPIONATE 0.05 % EX CREA: TOPICAL_CREAM | Freq: Two times a day (BID) | CUTANEOUS | 2 refills | Status: AC

## 2024-03-12 MED ORDER — MESALAMINE 1.2 G PO TBEC: 1.2000 g | DELAYED_RELEASE_TABLET | Freq: Four times a day (QID) | ORAL | 6 refills | Status: AC

## 2024-03-12 NOTE — Patient Instructions (Addendum)
 _______________________________________________________  If your blood pressure at your visit was 140/90 or greater, please contact your primary care physician to follow up on this.  _______________________________________________________  If you are age 45 or older, your body mass index should be between 23-30. Your Body mass index is 27.23 kg/m. If this is out of the aforementioned range listed, please consider follow up with your Primary Care Provider.  If you are age 12 or younger, your body mass index should be between 19-25. Your Body mass index is 27.23 kg/m. If this is out of the aformentioned range listed, please consider follow up with your Primary Care Provider.   ________________________________________________________  The [place] [facility] providers would like to encourage you to use [facility] to communicate with providers for non-urgent requests or questions.  Due to long hold times on the telephone, sending your provider a message by [facility] may be a faster and more efficient way to get a response.  Please allow 48 business hours for a response.  Please remember that this is for non-urgent requests.  _______________________________________________________  [facility] is using a team-based approach to care.  Your team is made up of your doctor and two to three APPS. Our APPS (Nurse Practitioners and Physician Assistants) work with your physician to ensure care continuity for you. They are fully qualified to address your health concerns and develop a treatment plan. They communicate directly with your gastroenterologist to care for you. Seeing the Advanced Practice Practitioners on your physician's team can help you by facilitating care more promptly, often allowing for earlier appointments, access to diagnostic testing, procedures, and other specialty referrals.   Dx code Screening for colorectal cancer [Z12.11, Z12.12]  Colonoscopy CPT code 54621  We have sent the following  medications to your pharmacy for you to pick up at your convenience: Imuran  Lialda  Cutivate   It has been recommended to you by your physician that you have a(n) Colonoscopy completed. Per your request, we did not schedule the procedure(s) today. Please contact our office at [phone] should you decide to have the procedure completed. You will be scheduled for a pre-visit and procedure at that time.  Thank you,  Dr. Lynnie Bring

## 2024-03-12 NOTE — Progress Notes (Signed)
 "     IMPRESSION and PLAN:    #1. Left sided ulcerative colitis-Dx at age 44 on colon 34 in [place]. Was treated with mesalamine /azathioprine  75 mg PO QD (stopped AZA on his own as he got better). Last colon 11/2017- UC upto 45 cm (mid- desc colon)-resumed AZA at 50mg  every day 11/2017. Last flare 02/01/2024 req pred.  #2. Lactose intol.  #3.  Acutely inflamed internal hemorrhoids.  Plan: -Colon with miralax. (For screening UC>15 yrs) -Lialda  4.8g po qd to continue -Continue imuran  50 mg po qd #30, 6 refills -fluticasone  cream 0.05% generic 30g 1 bid PR x 10 days, 2 refills -He is considering Rinvoq thereafter, and stopping Imuran /Lialda . -FU after colonoscopy.   HPI:    Jordan Greene is a 44 y.o. male FU Had exacerbation of UC in December 2025 with abdominal pain, increased diarrhea requiring prednisone  Feels better currently on mesalamine /Imuran   No nausea, vomiting, heartburn, regurgitation, odynophagia or dysphagia.  No significant diarrhea or constipation.  No melena or hematochezia. No unintentional weight loss. No abdominal pain.  He is due for repeat colonoscopy. Tolerating medicines very well otherwise.  No complaints except occasional hemorrhoidal problem more so in the last few days.  Rectal exam showed internal hemorrhoids.  No evidence of Crohn's disease.  Past Medical History:  Diagnosis Date   Angina pectoris 09/09/2020   Chest pain    Ulcerative colitis (HCC)     Current Outpatient Medications  Medication Sig Dispense Refill   azaTHIOprine  (IMURAN ) 50 MG tablet Take 1 tablet (50 mg total) by mouth daily. Keep upcoming February appointment please 90 tablet 0   mesalamine  (LIALDA ) 1.2 g EC tablet Take 1 tablet (1.2 g total) by mouth 4 (four) times daily. Keep upcoming February appointment please 360 tablet 0   Multiple Vitamin (MULTIVITAMIN) capsule Take 1 capsule by mouth daily.     predniSONE  (DELTASONE ) 10 MG tablet Take 4 tablets (40 mg total) by mouth  daily with breakfast for 14 days, THEN 3.5 tablets (35 mg total) daily with breakfast for 7 days, THEN 3 tablets (30 mg total) daily with breakfast for 7 days, THEN 2.5 tablets (25 mg total) daily with breakfast for 7 days, THEN 2 tablets (20 mg total) daily with breakfast for 7 days, THEN 1.5 tablets (15 mg total) daily with breakfast for 7 days, THEN 1 tablet (10 mg total) daily with breakfast for 7 days, THEN 0.5 tablets (5 mg total) daily with breakfast for 7 days. 154 tablet 0   No current facility-administered medications for this visit.    Past Surgical History:  Procedure Laterality Date   ARTHROSCOPIC REPAIR ACL     COLONOSCOPY  11/16/2017    Family History  Problem Relation Age of Onset   Colon cancer Neg Hx    Esophageal cancer Neg Hx    Heart disease Neg Hx    Colon polyps Neg Hx    Rectal cancer Neg Hx    Stomach cancer Neg Hx     Social History   Tobacco Use   Smoking status: Never   Smokeless tobacco: Never  Vaping Use   Vaping status: Never Used  Substance Use Topics   Alcohol use: No   Drug use: No    Allergies  Allergen Reactions   Beef Allergy     Other Reaction(s): GI Intolerance   Milk Protein Nausea Only    Other Reaction(s): GI Intolerance   Other     Dairy-Milk Red Meat  Review of Systems: All systems reviewed and negative except where noted in HPI.    Physical Exam:     BP 110/64   Pulse 74   Ht 5' 6.5 (1.689 m)   Wt 171 lb 4 oz (77.7 kg)   BMI 27.23 kg/m   GENERAL:  Alert, oriented, cooperative, not in acute distress. PSYCH: :Pleasant, normal mood and affect. HEENT:  conjunctiva pink, mucous membranes moist, neck supple without masses. No jaundice. CARDIAC:  S1 S2 normal. No murmers. PULM: Normal respiratory effort, lungs CTA bilaterally, no wheezing. ABDOMEN: Inspection: No visible peristalsis, no abnormal pulsations, skin normal.  Palpation/percussion: Soft, mild tenderness on deep palpation, no rebound., nondistended, no  rigidity, no abnormal dullness to percussion, no hepatosplenomegaly and no palpable abdominal masses.  Auscultation: Normal bowel sounds, no abdominal bruits. Rectal exam: Inflamed internal hemorrhoids.  Minimally tender.  Heme-negative stools. SKIN:  turgor, no lesions seen. Musculoskeletal:  Normal muscle tone, normal strength. NEURO: Alert and oriented x 3, no focal neurologic deficits.   Jessice Madill,MD 03/12/2024, 11:42 AM   CC Dr Gable   "
